# Patient Record
Sex: Female | Born: 1970 | Race: Black or African American | Hispanic: No | Marital: Single | Smoking: Never smoker
Health system: Southern US, Community
[De-identification: ages and names within clinical notes are randomized; demographics above are authoritative.]

## PROBLEM LIST (undated history)

## (undated) DIAGNOSIS — K219 Gastro-esophageal reflux disease without esophagitis: Secondary | ICD-10-CM

## (undated) DIAGNOSIS — E785 Hyperlipidemia, unspecified: Secondary | ICD-10-CM

## (undated) DIAGNOSIS — I10 Essential (primary) hypertension: Secondary | ICD-10-CM

## (undated) DIAGNOSIS — E119 Type 2 diabetes mellitus without complications: Secondary | ICD-10-CM

## (undated) HISTORY — DX: Type 2 diabetes mellitus without complications: E11.9

## (undated) HISTORY — DX: Essential (primary) hypertension: I10

## (undated) HISTORY — DX: Hyperlipidemia, unspecified: E78.5

## (undated) HISTORY — DX: Gastro-esophageal reflux disease without esophagitis: K21.9

---

## 2005-06-06 ENCOUNTER — Other Ambulatory Visit: Admission: RE | Admit: 2005-06-06 | Discharge: 2005-06-06 | Payer: Self-pay | Admitting: Family Medicine

## 2010-06-07 ENCOUNTER — Encounter
Admission: RE | Admit: 2010-06-07 | Discharge: 2010-06-07 | Payer: Self-pay | Source: Home / Self Care | Attending: Obstetrics & Gynecology | Admitting: Obstetrics & Gynecology

## 2011-05-13 ENCOUNTER — Other Ambulatory Visit: Payer: Self-pay | Admitting: Obstetrics & Gynecology

## 2011-05-13 DIAGNOSIS — Z1231 Encounter for screening mammogram for malignant neoplasm of breast: Secondary | ICD-10-CM

## 2011-06-09 ENCOUNTER — Ambulatory Visit
Admission: RE | Admit: 2011-06-09 | Discharge: 2011-06-09 | Disposition: A | Discharge status: Home / Self Care | Payer: BC Managed Care – PPO | Source: Ambulatory Visit | Attending: Obstetrics & Gynecology | Admitting: Obstetrics & Gynecology

## 2011-06-09 DIAGNOSIS — Z1231 Encounter for screening mammogram for malignant neoplasm of breast: Secondary | ICD-10-CM

## 2012-05-04 ENCOUNTER — Other Ambulatory Visit: Payer: Self-pay | Admitting: Obstetrics & Gynecology

## 2012-05-04 ENCOUNTER — Other Ambulatory Visit: Payer: Self-pay | Admitting: Internal Medicine

## 2012-05-04 DIAGNOSIS — Z1231 Encounter for screening mammogram for malignant neoplasm of breast: Secondary | ICD-10-CM

## 2012-05-13 ENCOUNTER — Ambulatory Visit: Payer: BC Managed Care – PPO

## 2013-05-13 ENCOUNTER — Other Ambulatory Visit: Payer: Self-pay

## 2013-05-13 DIAGNOSIS — Z1231 Encounter for screening mammogram for malignant neoplasm of breast: Secondary | ICD-10-CM

## 2013-06-17 ENCOUNTER — Ambulatory Visit
Admission: RE | Admit: 2013-06-17 | Discharge: 2013-06-17 | Disposition: A | Discharge status: Home / Self Care | Payer: BC Managed Care – PPO | Source: Ambulatory Visit

## 2013-06-17 DIAGNOSIS — Z1231 Encounter for screening mammogram for malignant neoplasm of breast: Secondary | ICD-10-CM

## 2014-05-15 ENCOUNTER — Other Ambulatory Visit: Payer: Self-pay

## 2014-05-15 DIAGNOSIS — Z1231 Encounter for screening mammogram for malignant neoplasm of breast: Secondary | ICD-10-CM

## 2014-06-22 ENCOUNTER — Ambulatory Visit
Admission: RE | Admit: 2014-06-22 | Discharge: 2014-06-22 | Disposition: A | Discharge status: Home / Self Care | Payer: BC Managed Care – PPO | Source: Ambulatory Visit

## 2014-06-22 DIAGNOSIS — Z1231 Encounter for screening mammogram for malignant neoplasm of breast: Secondary | ICD-10-CM

## 2014-08-25 ENCOUNTER — Ambulatory Visit: Payer: BLUE CROSS/BLUE SHIELD | Admitting: Podiatry

## 2016-05-02 ENCOUNTER — Other Ambulatory Visit: Payer: Self-pay | Admitting: Family Medicine

## 2016-05-02 DIAGNOSIS — Z1231 Encounter for screening mammogram for malignant neoplasm of breast: Secondary | ICD-10-CM

## 2016-06-06 ENCOUNTER — Ambulatory Visit
Admission: RE | Admit: 2016-06-06 | Discharge: 2016-06-06 | Disposition: A | Discharge status: Home / Self Care | Payer: 59 | Source: Ambulatory Visit | Attending: Family Medicine | Admitting: Family Medicine

## 2016-06-06 DIAGNOSIS — Z1231 Encounter for screening mammogram for malignant neoplasm of breast: Secondary | ICD-10-CM

## 2017-03-27 ENCOUNTER — Other Ambulatory Visit: Payer: Self-pay | Admitting: Family Medicine

## 2017-03-27 DIAGNOSIS — Z1231 Encounter for screening mammogram for malignant neoplasm of breast: Secondary | ICD-10-CM

## 2017-06-08 ENCOUNTER — Encounter: Payer: Self-pay | Admitting: Radiology

## 2017-06-08 ENCOUNTER — Ambulatory Visit
Admission: RE | Admit: 2017-06-08 | Discharge: 2017-06-08 | Disposition: A | Discharge status: Home / Self Care | Payer: 59 | Source: Ambulatory Visit | Attending: Family Medicine | Admitting: Family Medicine

## 2017-06-08 DIAGNOSIS — Z1231 Encounter for screening mammogram for malignant neoplasm of breast: Secondary | ICD-10-CM

## 2018-03-04 ENCOUNTER — Other Ambulatory Visit: Payer: Self-pay | Admitting: Family Medicine

## 2018-03-04 DIAGNOSIS — Z1231 Encounter for screening mammogram for malignant neoplasm of breast: Secondary | ICD-10-CM

## 2018-06-15 ENCOUNTER — Ambulatory Visit
Admission: RE | Admit: 2018-06-15 | Discharge: 2018-06-15 | Disposition: A | Discharge status: Home / Self Care | Payer: 59 | Source: Ambulatory Visit | Attending: Family Medicine | Admitting: Family Medicine

## 2018-06-15 DIAGNOSIS — Z1231 Encounter for screening mammogram for malignant neoplasm of breast: Secondary | ICD-10-CM

## 2019-05-06 ENCOUNTER — Other Ambulatory Visit: Payer: Self-pay | Admitting: Family Medicine

## 2019-05-06 DIAGNOSIS — Z1231 Encounter for screening mammogram for malignant neoplasm of breast: Secondary | ICD-10-CM

## 2019-07-01 ENCOUNTER — Ambulatory Visit: Payer: 59

## 2019-09-19 ENCOUNTER — Ambulatory Visit: Payer: BLUE CROSS/BLUE SHIELD | Attending: Internal Medicine

## 2019-09-19 DIAGNOSIS — Z23 Encounter for immunization: Secondary | ICD-10-CM

## 2019-09-19 NOTE — Progress Notes (Signed)
   Covid-19 Vaccination Clinic  Name:  Wendy Sanford    MRN: [id number] DOB: 1971-06-03  09/19/2019  [name] was observed post Covid-19 immunization for 15 minutes without incident. She was provided with Vaccine Information Sheet and instruction to access the V-Safe system.   [name] was instructed to call 911 with any severe reactions post vaccine: Marland Kitchen Difficulty breathing  . Swelling of face and throat  . A fast heartbeat  . A bad rash all over body  . Dizziness and weakness   Immunizations Administered    Name Date Dose VIS Date Route   Pfizer COVID-19 Vaccine 09/19/2019 11:55 AM 0.3 mL 06/03/2019 Intramuscular   Manufacturer: [employer], [employer]   Lot: [id number]   NDC: [address]-6

## 2019-10-12 ENCOUNTER — Ambulatory Visit: Payer: BLUE CROSS/BLUE SHIELD | Attending: Internal Medicine

## 2019-10-12 DIAGNOSIS — Z23 Encounter for immunization: Secondary | ICD-10-CM

## 2019-10-12 NOTE — Progress Notes (Signed)
   Covid-19 Vaccination Clinic  Name:  Wendy Sanford    MRN: [id number] DOB: [birth year]  10/12/2019  [name] was observed post Covid-19 immunization for 15 minutes without incident. She was provided with Vaccine Information Sheet and instruction to access the V-Safe system.   [name] was instructed to call 911 with any severe reactions post vaccine: Marland Kitchen Difficulty breathing  . Swelling of face and throat  . A fast heartbeat  . A bad rash all over body  . Dizziness and weakness   Immunizations Administered    Name Date Dose VIS Date Route   Pfizer COVID-19 Vaccine 10/12/2019  8:15 AM 0.3 mL 08/17/2018 Intramuscular   Manufacturer: [employer], [employer]   Lot: [id number]   NDC: [address]-6

## 2020-02-02 DIAGNOSIS — E785 Hyperlipidemia, unspecified: Secondary | ICD-10-CM | POA: Diagnosis not present

## 2020-02-02 DIAGNOSIS — R29818 Other symptoms and signs involving the nervous system: Secondary | ICD-10-CM | POA: Diagnosis not present

## 2020-02-02 DIAGNOSIS — I6350 Cerebral infarction due to unspecified occlusion or stenosis of unspecified cerebral artery: Secondary | ICD-10-CM | POA: Diagnosis not present

## 2020-02-02 DIAGNOSIS — I10 Essential (primary) hypertension: Secondary | ICD-10-CM | POA: Diagnosis not present

## 2020-04-13 ENCOUNTER — Other Ambulatory Visit: Payer: Self-pay | Admitting: Family Medicine

## 2020-04-13 DIAGNOSIS — Z1231 Encounter for screening mammogram for malignant neoplasm of breast: Secondary | ICD-10-CM

## 2020-04-18 ENCOUNTER — Ambulatory Visit
Admission: RE | Admit: 2020-04-18 | Discharge: 2020-04-18 | Disposition: A | Discharge status: Home / Self Care | Payer: BLUE CROSS/BLUE SHIELD | Source: Ambulatory Visit | Attending: Family Medicine | Admitting: Family Medicine

## 2020-04-18 ENCOUNTER — Other Ambulatory Visit: Payer: Self-pay

## 2020-04-18 DIAGNOSIS — Z1231 Encounter for screening mammogram for malignant neoplasm of breast: Secondary | ICD-10-CM | POA: Diagnosis not present

## 2020-08-07 DIAGNOSIS — I69398 Other sequelae of cerebral infarction: Secondary | ICD-10-CM | POA: Diagnosis not present

## 2020-08-07 DIAGNOSIS — R269 Unspecified abnormalities of gait and mobility: Secondary | ICD-10-CM | POA: Diagnosis not present

## 2020-08-07 DIAGNOSIS — I639 Cerebral infarction, unspecified: Secondary | ICD-10-CM | POA: Diagnosis not present

## 2020-08-07 DIAGNOSIS — J329 Chronic sinusitis, unspecified: Secondary | ICD-10-CM | POA: Diagnosis not present

## 2020-08-07 DIAGNOSIS — M2550 Pain in unspecified joint: Secondary | ICD-10-CM | POA: Diagnosis not present

## 2020-09-26 DIAGNOSIS — E1159 Type 2 diabetes mellitus with other circulatory complications: Secondary | ICD-10-CM | POA: Diagnosis not present

## 2020-09-26 DIAGNOSIS — I152 Hypertension secondary to endocrine disorders: Secondary | ICD-10-CM | POA: Diagnosis not present

## 2020-09-26 DIAGNOSIS — B372 Candidiasis of skin and nail: Secondary | ICD-10-CM | POA: Diagnosis not present

## 2020-09-26 DIAGNOSIS — Z0001 Encounter for general adult medical examination with abnormal findings: Secondary | ICD-10-CM | POA: Diagnosis not present

## 2020-09-26 DIAGNOSIS — J329 Chronic sinusitis, unspecified: Secondary | ICD-10-CM | POA: Diagnosis not present

## 2020-09-26 DIAGNOSIS — E1129 Type 2 diabetes mellitus with other diabetic kidney complication: Secondary | ICD-10-CM | POA: Diagnosis not present

## 2020-09-26 DIAGNOSIS — Z23 Encounter for immunization: Secondary | ICD-10-CM | POA: Diagnosis not present

## 2020-09-26 DIAGNOSIS — Z1211 Encounter for screening for malignant neoplasm of colon: Secondary | ICD-10-CM | POA: Diagnosis not present

## 2020-09-26 DIAGNOSIS — E118 Type 2 diabetes mellitus with unspecified complications: Secondary | ICD-10-CM | POA: Diagnosis not present

## 2020-09-26 DIAGNOSIS — Z0000 Encounter for general adult medical examination without abnormal findings: Secondary | ICD-10-CM | POA: Diagnosis not present

## 2020-12-05 DIAGNOSIS — E1165 Type 2 diabetes mellitus with hyperglycemia: Secondary | ICD-10-CM | POA: Diagnosis not present

## 2020-12-05 DIAGNOSIS — G4489 Other headache syndrome: Secondary | ICD-10-CM | POA: Diagnosis not present

## 2020-12-05 DIAGNOSIS — E1159 Type 2 diabetes mellitus with other circulatory complications: Secondary | ICD-10-CM | POA: Diagnosis not present

## 2020-12-05 DIAGNOSIS — R040 Epistaxis: Secondary | ICD-10-CM | POA: Diagnosis not present

## 2020-12-17 DIAGNOSIS — R040 Epistaxis: Secondary | ICD-10-CM | POA: Diagnosis not present

## 2020-12-17 DIAGNOSIS — G4489 Other headache syndrome: Secondary | ICD-10-CM | POA: Diagnosis not present

## 2020-12-17 DIAGNOSIS — Z1211 Encounter for screening for malignant neoplasm of colon: Secondary | ICD-10-CM | POA: Diagnosis not present

## 2020-12-17 DIAGNOSIS — Z6841 Body Mass Index (BMI) 40.0 and over, adult: Secondary | ICD-10-CM | POA: Diagnosis not present

## 2021-02-04 DIAGNOSIS — I69398 Other sequelae of cerebral infarction: Secondary | ICD-10-CM | POA: Diagnosis not present

## 2021-02-04 DIAGNOSIS — I639 Cerebral infarction, unspecified: Secondary | ICD-10-CM | POA: Diagnosis not present

## 2021-02-04 DIAGNOSIS — E785 Hyperlipidemia, unspecified: Secondary | ICD-10-CM | POA: Diagnosis not present

## 2021-02-04 DIAGNOSIS — G40909 Epilepsy, unspecified, not intractable, without status epilepticus: Secondary | ICD-10-CM | POA: Diagnosis not present

## 2021-05-28 DIAGNOSIS — E118 Type 2 diabetes mellitus with unspecified complications: Secondary | ICD-10-CM | POA: Diagnosis not present

## 2021-05-28 DIAGNOSIS — R635 Abnormal weight gain: Secondary | ICD-10-CM | POA: Diagnosis not present

## 2021-05-28 DIAGNOSIS — D696 Thrombocytopenia, unspecified: Secondary | ICD-10-CM | POA: Diagnosis not present

## 2021-05-28 DIAGNOSIS — R600 Localized edema: Secondary | ICD-10-CM | POA: Diagnosis not present

## 2021-05-28 DIAGNOSIS — E1165 Type 2 diabetes mellitus with hyperglycemia: Secondary | ICD-10-CM | POA: Diagnosis not present

## 2021-05-28 LAB — CBC AND DIFFERENTIAL: Hemoglobin: 10.4 — AB (ref 12.0–16.0)

## 2021-05-28 LAB — MICROALBUMIN / CREATININE URINE RATIO

## 2021-05-28 LAB — BASIC METABOLIC PANEL: Creatinine: 0.9 (ref 0.5–1.1)

## 2021-05-28 LAB — HEMOGLOBIN A1C: Hemoglobin A1C: 9.1

## 2021-05-31 DIAGNOSIS — R600 Localized edema: Secondary | ICD-10-CM | POA: Diagnosis not present

## 2021-07-23 ENCOUNTER — Encounter: Payer: Self-pay | Admitting: Family Medicine

## 2021-07-23 ENCOUNTER — Other Ambulatory Visit: Payer: Self-pay

## 2021-07-23 ENCOUNTER — Ambulatory Visit (INDEPENDENT_AMBULATORY_CARE_PROVIDER_SITE_OTHER): Payer: BLUE CROSS/BLUE SHIELD | Admitting: Family Medicine

## 2021-07-23 VITALS — BP 140/90 | HR 87 | Temp 98.4°F | Ht 66.0 in | Wt 284.5 lb

## 2021-07-23 DIAGNOSIS — E1165 Type 2 diabetes mellitus with hyperglycemia: Secondary | ICD-10-CM | POA: Insufficient documentation

## 2021-07-23 DIAGNOSIS — D649 Anemia, unspecified: Secondary | ICD-10-CM

## 2021-07-23 DIAGNOSIS — R600 Localized edema: Secondary | ICD-10-CM

## 2021-07-23 LAB — CBC WITH DIFFERENTIAL/PLATELET
Basophils Absolute: 0 10*3/uL (ref 0.0–0.1)
Basophils Relative: 0.4 % (ref 0.0–3.0)
Eosinophils Absolute: 0.1 10*3/uL (ref 0.0–0.7)
Eosinophils Relative: 1.5 % (ref 0.0–5.0)
HCT: 33.6 % — ABNORMAL LOW (ref 36.0–46.0)
Hemoglobin: 10.8 g/dL — ABNORMAL LOW (ref 12.0–15.0)
Lymphocytes Relative: 16.2 % (ref 12.0–46.0)
Lymphs Abs: 0.8 10*3/uL (ref 0.7–4.0)
MCHC: 32.1 g/dL (ref 30.0–36.0)
MCV: 82.6 fl (ref 78.0–100.0)
Monocytes Absolute: 0.4 10*3/uL (ref 0.1–1.0)
Monocytes Relative: 8.3 % (ref 3.0–12.0)
Neutro Abs: 3.6 10*3/uL (ref 1.4–7.7)
Neutrophils Relative %: 73.6 % (ref 43.0–77.0)
Platelets: 144 10*3/uL — ABNORMAL LOW (ref 150.0–400.0)
RBC: 4.06 Mil/uL (ref 3.87–5.11)
RDW: 16 % — ABNORMAL HIGH (ref 11.5–15.5)
WBC: 4.8 10*3/uL (ref 4.0–10.5)

## 2021-07-23 LAB — IBC + FERRITIN
Ferritin: 26.3 ng/mL (ref 10.0–291.0)
Iron: 42 ug/dL (ref 42–145)
Saturation Ratios: 12.6 % — ABNORMAL LOW (ref 20.0–50.0)
TIBC: 333.2 ug/dL (ref 250.0–450.0)
Transferrin: 238 mg/dL (ref 212.0–360.0)

## 2021-07-23 LAB — VITAMIN B12: Vitamin B-12: 505 pg/mL (ref 211–911)

## 2021-07-23 MED ORDER — EMPAGLIFLOZIN 25 MG PO TABS: 25.0000 mg | ORAL_TABLET | Freq: Every day | ORAL | 1 refills | Status: DC

## 2021-07-23 MED ORDER — GLUCOSE BLOOD VI STRP: 1.0000 | ORAL_STRIP | Freq: Every day | 3 refills | Status: AC

## 2021-07-23 MED ORDER — FUROSEMIDE 20 MG PO TABS: 20.0000 mg | ORAL_TABLET | Freq: Every day | ORAL | 1 refills | Status: DC

## 2021-07-23 NOTE — Patient Instructions (Addendum)
Welcome to [facility]! It was a pleasure meeting you today.  As discussed, Please schedule a 3 month follow up visit today.  Goals for fasting sugar-<130.  Before meal <140.  2 hrs after meal <160.  Jardiance-start with 1/2 tab per day for 1 week then increase to whole tab Lasix(furosemide) for fluid for 2-3 days and let me know.  PLEASE NOTE:  If you had any LAB tests please let us know if you have not heard back within a few days. You may see your results on MyChart before we have a chance to review them but we will give you a call once they are reviewed by [place]. If we ordered any REFERRALS today, please let us know if you have not heard from their office within the next week.  Let us know through MyChart if you are needing REFILLS, or have your pharmacy send [place] the request. You can also use MyChart to communicate with me or any office staff.  Please try these tips to maintain a healthy lifestyle:  Eat most of your calories during the day when you are active. Eliminate processed foods including packaged sweets (pies, cakes, cookies), reduce intake of potatoes, white bread, white pasta, and white rice. Look for whole grain options, oat flour or almond flour.  Each meal should contain half fruits/vegetables, one quarter protein, and one quarter carbs (no bigger than a computer mouse).  Cut down on sweet beverages. This includes juice, soda, and sweet tea. Also watch fruit intake, though this is a healthier sweet option, it still contains natural sugar! Limit to 3 servings daily.  Drink at least 1 glass of water with each meal and aim for at least 8 glasses per day  Exercise at least 150 minutes every week.

## 2021-07-23 NOTE — Addendum Note (Signed)
Addended by: Angelena Sole on: 07/23/2021 01:02 PM   Modules accepted: Orders

## 2021-07-23 NOTE — Progress Notes (Signed)
New Patient Office Visit  Subjective:  Patient ID: Wendy Sanford, female    DOB: 10/11/1970  Age: 51 y.o. MRN: [id number]  CC:  Chief Complaint  Patient presents with   Establish Care    HPI Wendy Sanford presents for establish care-xfer from [facility]   Edema intermitt and intermitt numbness hands 1 yr ago.  Numbness improved x pinkie still "sensitive" to touch it-L worst.  Pt R handed. Edema in legs have improved some.   Weight increased 20# 1 yr ago after vacation. Legs swollen and tight. Had echo and dopplers-reviewed w/pt.  2.  DM type 2-checks 1-2x/wk-192 fasting.   Drinks a lot of sweet tea.  No exercise.  Willing to see nutritionist. Doesn't like needles.  Had labs done in December w/[facility].   3.  Menses regular until 1 yr ago.  Pt not SA.  LMP-Nov.  Not heavy-  reviewed labs from 05/28/21   4.  Anemia and Plts low-now bleeding pt aware of.        Past Medical History:  Diagnosis Date   Diabetes mellitus without complication (HCC)    GERD (gastroesophageal reflux disease)    Hyperlipidemia    Hypertension     History reviewed. No pertinent surgical history.  Family History  Problem Relation Age of Onset   Hypertension Mother    Diabetes Mother    Heart attack Father    Diabetes Brother    Hypertension Brother    Breast cancer Cousin        unsure how old   Breast cancer Cousin        unsure how old    Social History   Socioeconomic History   Marital status: Single    Spouse name: Not on file   Number of children: 0   Years of education: Not on file   Highest education level: Not on file  Occupational History   Not on file  Tobacco Use   Smoking status: Never   Smokeless tobacco: Never  Vaping Use   Vaping Use: Never used  Substance and Sexual Activity   Alcohol use: Never   Drug use: Never   Sexual activity: Not Currently  Other Topics Concern   Not on file  Social History Narrative   Bussiness analyst.   Has boyfriend   Social  Determinants of Health   Financial Resource Strain: Not on file  Food Insecurity: Not on file  Transportation Needs: Not on file  Physical Activity: Not on file  Stress: Not on file  Social Connections: Not on file  Intimate Partner Violence: Not on file    ROS: negative/noncontributory except as in HPI Allergies-year round-meds hold H/o constipation-metamucil helps  Glaucoma appt coming up in June.  Saw opt recently.   Objective:   Today's Vitals: BP 140/90    Pulse 87    Temp 98.4 F (36.9 C) (Temporal)    Ht 5\' 6"  (1.676 m)    Wt 284 lb 8 oz (129 kg)    SpO2 99%    BMI 45.92 kg/m   Gen: WDWN NAD MOAAF HEENT: NCAT, conjunctiva not injected, sclera nonicteric NECK:  supple, no thyromegaly, no nodes, no carotid bruits.  Acanthosis nigrans CARDIAC: RRR, S1S2+, 1/6 sys murmur. DP 2+B LUNGS: CTAB. No wheezes ABDOMEN:  BS+, soft, NTND, No HSM, no masses EXT:  chronic tense edema w/some hyperpigmentation MSK: no gross abnormalities.  NEURO: A&O x3.  CN II-XII intact.  PSYCH: normal mood. Good eye contact  Assessment & Plan:   Problem List Items Addressed This Visit       Endocrine   Type 2 diabetes mellitus with hyperglycemia, without long-term current use of insulin (HCC) - Primary   Relevant Medications   olmesartan-hydrochlorothiazide (BENICAR HCT) 40-25 MG tablet   metFORMIN (GLUCOPHAGE-XR) 500 MG 24 hr tablet   atorvastatin (LIPITOR) 20 MG tablet   glucose blood test strip   empagliflozin (JARDIANCE) 25 MG TABS tablet     Other   Localized edema   Other Visit Diagnoses     Anemia, unspecified type       Relevant Orders   CBC with Differential/Platelet   IBC + Ferritin   Vitamin B12   ANA      DM type 2 w/hyperglycemia. Uncontrolled.  A1C 9.1 on 05/28/21.  Microalb creat ratio unable to calculate-low.  Cr 0.88.  will add Jardiance.  SED.  Refer nutritionist.  Edema-had echo and dopplers.  Will trial lasix few days.  Call w/progress Anemia and plts  low-check labs and ANA. Morbid obesisty-discussed TLC for 74minutes.  Refer nutritionist.  Declines ozempic for now.   F/u 3 mo Spent 50 minutes w/pt reviewing prev doctor's visit, labs, echo, doppler, educating pt on diet/exercise, uncoltrolled DM, plan, etc.   Outpatient Encounter Medications as of 07/23/2021  Medication Sig   atorvastatin (LIPITOR) 20 MG tablet Take 20 mg by mouth daily.   chlorhexidine (PERIDEX) 0.12 % solution 15 mL by Oromucosal route Two (2) times a day.   empagliflozin (JARDIANCE) 25 MG TABS tablet Take 1 tablet (25 mg total) by mouth daily before breakfast.   fluticasone (FLONASE) 50 MCG/ACT nasal spray Place into both nostrils daily.   furosemide (LASIX) 20 MG tablet Take 1 tablet (20 mg total) by mouth daily.   METAMUCIL FIBER PO    metFORMIN (GLUCOPHAGE-XR) 500 MG 24 hr tablet Take 1,000 mg by mouth 2 (two) times daily.   montelukast (SINGULAIR) 10 MG tablet Take 10 mg by mouth daily.   olmesartan-hydrochlorothiazide (BENICAR HCT) 40-25 MG tablet Take 1 tablet by mouth daily.   omeprazole (PRILOSEC) 20 MG capsule Take by mouth.   [DISCONTINUED] glucose blood test strip 1 each daily.   glucose blood test strip 1 each by Other route daily.   [DISCONTINUED] omeprazole (PRILOSEC OTC) 20 MG tablet    No facility-administered encounter medications on file as of 07/23/2021.    Follow-up: Return in about 3 months (around 10/20/2021) for DM.   Wellington Hampshire, MD

## 2021-07-25 ENCOUNTER — Telehealth: Payer: Self-pay | Admitting: Family Medicine

## 2021-07-25 NOTE — Telephone Encounter (Signed)
Pt returning call for Wendy Sanford - lucia

## 2021-07-25 NOTE — Telephone Encounter (Signed)
Returned call to patient. Patient notified of result note message from Dr. Cherlynn Kaiser.

## 2021-07-26 ENCOUNTER — Telehealth: Payer: Self-pay | Admitting: *Deleted

## 2021-07-26 LAB — ANA: Anti Nuclear Antibody (ANA): NEGATIVE

## 2021-07-26 NOTE — Telephone Encounter (Signed)
Called patient to go over remaining lab results. Patient wanted to know if it was possible to get a note saying that going to a gym or buying equipment to exercise would be medically necessary for her. Patient stated that she was reading her benefits and it stated that she could use HSA to pay for cheap equipment or a gym membership if it was deemed medically necessary.

## 2021-07-29 ENCOUNTER — Encounter: Payer: Self-pay | Admitting: Family Medicine

## 2021-07-29 NOTE — Telephone Encounter (Signed)
Letter sent through mychart. Patient notified and verbalized understanding.

## 2021-08-06 ENCOUNTER — Ambulatory Visit: Payer: BLUE CROSS/BLUE SHIELD | Admitting: Family Medicine

## 2021-08-12 DIAGNOSIS — G40909 Epilepsy, unspecified, not intractable, without status epilepticus: Secondary | ICD-10-CM | POA: Diagnosis not present

## 2021-08-12 DIAGNOSIS — I639 Cerebral infarction, unspecified: Secondary | ICD-10-CM | POA: Diagnosis not present

## 2021-08-30 ENCOUNTER — Encounter: Payer: BLUE CROSS/BLUE SHIELD | Attending: Family Medicine | Admitting: Dietician

## 2021-08-30 ENCOUNTER — Encounter: Payer: Self-pay | Admitting: Dietician

## 2021-08-30 ENCOUNTER — Other Ambulatory Visit: Payer: Self-pay

## 2021-08-30 DIAGNOSIS — E1165 Type 2 diabetes mellitus with hyperglycemia: Secondary | ICD-10-CM | POA: Insufficient documentation

## 2021-08-30 NOTE — Patient Instructions (Addendum)
Call your insurance to see what SGLT2 is better covered.  You have the discount card for [place].  Discuss with your MD for a prescription. ? ?Consider asking your doctor for a prescription for a FreeStyle Libre3. [facility].  Your phone supports this app. ? ?Low sodium products: ?Plant Strong Chili ?Silver Palate low sodium pasta sauce ?Ezekiel Bread ? ?Recipe ideas: ?Well your world - you tube ?Dicky Doe - you tube ?Sheet pan meal ? ?Tea with Monk fruit or stevia or less sugar ? ?Calorie Brooke Dare app ? ?Mindful eating: ?Choices ?Eat slowly and stop when satisfied ?Eat away from distraction ? ? ?

## 2021-08-30 NOTE — Progress Notes (Signed)
? ?Diabetes Self-Management Education ? ?Visit Type: First/Initial ? ?Appt. Start Time: 0910 Appt. End Time: [id number] ? ?08/30/2021 ? ?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 female with a diagnosis of Diabetes: Type 2.  ? ?ASSESSMENT ?Patient is here today alone. ?She would like some new meal ideas as they are getting bored. ?She states that she is trying to do things differently this year than other years.   Boyfriend is resistant to some of the diet changes at times.  She has gotten an eye exam. ?She is not checking her blood glucose.  Glucose was 196 today in office. ? ?History includes type 2 diabetes, obesity, edema (legs, feet, ankle), GERD, HLD, HTN. ?Medications include:  Jardiance (not taking due to expense), metformin, atorvastatin, lasis.  She has a MVI but has not started them. ?Labs noted to include 9.1% A1C 05/28/2021 ? ?Weight hx: ?281 lbs 08/30/2021 ?284 lbs 07/23/2021 ? ?Patient lives with her boyfriend. (Boyfriend has diabetes.) She does the shopping and cooking.  She works for [employer] from home (first shift). ?Gained 20 lbs in the past 9 month after a vacation to [place].  Perimenopausal.  Increased edema.  She has stopped adding salt at the table and very small amounts of sea salt in cooking.  "There is always a fight in the house about salt as her boyfriend wants things more seasoned." ?She had some education in the past through a diabetes eating class at work about 5 years ago. ?Just joined the new [employer] and has not yet started.  Plans on going with her boyfriend. ?Added her to the Type 2 diabetes support group email list per her request. ? ?Height 5\' 6"  (1.676 m), weight 282 lb (127.9 kg). ?Body mass index is 45.52 kg/m?. ? ? Diabetes Self-Management Education - 08/30/21 0935   ? ?  ? Visit Information  ? Visit Type First/Initial   ?  ? Initial Visit  ? Diabetes Type Type 2   ? Are you currently following a meal plan? No   ? Are you taking your medications as  prescribed? No   ? Date Diagnosed 2015   ?  ? Health Coping  ? How would you rate your overall health? Good   ?  ? Psychosocial Assessment  ? Patient Belief/Attitude about Diabetes Motivated to manage diabetes   ? Self-care barriers None   ? Self-management support Doctor's office   ? Other persons present Patient   ? Patient Concerns Nutrition/Meal planning;Glycemic Control;Medication   ? Special Needs None   ? Preferred Learning Style No preference indicated   ? Learning Readiness Ready   ? How often do you need to have someone help you when you read instructions, pamphlets, or other written materials from your doctor or pharmacy? 1 - Never   ? What is the last grade level you completed in school? 4 years college   ?  ? Pre-Education Assessment  ? Patient understands the diabetes disease and treatment process. Needs Instruction   ? Patient understands incorporating nutritional management into lifestyle. Needs Instruction   ? Patient undertands incorporating physical activity into lifestyle. Needs Instruction   ? Patient understands using medications safely. Needs Instruction   ? Patient understands monitoring blood glucose, interpreting and using results Needs Instruction   ? Patient understands prevention, detection, and treatment of acute complications. Needs Instruction   ? Patient understands prevention, detection, and treatment of chronic complications. Needs Instruction   ? Patient understands  how to develop strategies to address psychosocial issues. Needs Instruction   ? Patient understands how to develop strategies to promote health/change behavior. Needs Instruction   ?  ? Complications  ? Last HgB A1C per patient/outside source 9.1 %   05/28/2021  ? How often do you check your blood sugar? --   once per week  ? Fasting Blood glucose range (mg/dL) >200   285  ? Postprandial Blood glucose range (mg/dL) 180-200   ? Number of hypoglycemic episodes per month 0   ? Have you had a dilated eye exam in the past 12  months? Yes   ? Have you had a dental exam in the past 12 months? Yes   ? Are you checking your feet? Yes   ? How many days per week are you checking your feet? 7   ?  ? Dietary Intake  ? Breakfast honey nut chex with almond milk OR instant oatmeal (raisin date walnut) OR boiled egg with grits or potatoes   ? Snack (morning) none   ? Lunch leftovers OR deli meat alone or with a sandwich, leftover vegetables   ? Snack (afternoon) rare   ? Dinner encheladas, occasional beans, rare rice, occasional sweet but then skips the starch   ? Snack (evening) none   ? Beverage(s) water, sweet tea, lemon perfect drinks (stevia), selter water, almond milk, occasional cranberry juice   ?  ? Exercise  ? Exercise Type ADL's   ? ?  ?  ? ?  ? ? ?Individualized Plan for Diabetes Self-Management Training:  ? ?Learning Objective:  Patient will have a greater understanding of diabetes self-management. ?Patient education plan is to attend individual and/or group sessions per assessed needs and concerns. ?  ?Plan:  ? ?Patient Instructions  ?Call your insurance to see what SGLT2 is better covered.  You have the discount card for [place].  Discuss with your MD for a prescription. ? ?Consider asking your doctor for a prescription for a FreeStyle Libre3. [facility]  Your phone supports this app. ? ?Low sodium products: ?Plant Strong Chili ?Silver Palate low sodium pasta sauce ?Ezekiel Bread ? ?Recipe ideas: ?Well your world - you tube ?Dondra Prader - you tube ?Sheet pan meal ? ?Tea with Monk fruit or stevia or less sugar ? ?Calorie Edison Pace app ? ?Mindful eating: ?Choices ?Eat slowly and stop when satisfied ?Eat away from distraction ? ? ? ?Expected Outcomes:    ? ?Education material provided: ADA - How to Thrive: A Guide for Your Journey with Diabetes, Food label handouts, Meal plan card, Snack sheet, and Diabetes Resources ? ?If problems or questions, patient to contact team via:  Phone ? ?Future DSME appointment:   ?

## 2021-09-11 ENCOUNTER — Ambulatory Visit: Payer: BLUE CROSS/BLUE SHIELD | Admitting: Registered"

## 2021-10-21 ENCOUNTER — Ambulatory Visit: Payer: BLUE CROSS/BLUE SHIELD | Admitting: Family Medicine

## 2021-10-23 ENCOUNTER — Other Ambulatory Visit: Payer: Self-pay | Admitting: *Deleted

## 2021-10-23 MED ORDER — ATORVASTATIN CALCIUM 20 MG PO TABS: 20.0000 mg | ORAL_TABLET | Freq: Every day | ORAL | 1 refills | Status: DC

## 2021-10-23 MED ORDER — OLMESARTAN MEDOXOMIL-HCTZ 40-25 MG PO TABS: 1.0000 | ORAL_TABLET | Freq: Every day | ORAL | 1 refills | Status: DC

## 2021-11-25 ENCOUNTER — Other Ambulatory Visit: Payer: Self-pay | Admitting: *Deleted

## 2021-11-25 ENCOUNTER — Telehealth: Payer: Self-pay | Admitting: *Deleted

## 2021-11-25 MED ORDER — FLUTICASONE PROPIONATE 50 MCG/ACT NA SUSP: 1.0000 | Freq: Every day | NASAL | 1 refills | Status: AC

## 2021-11-25 NOTE — Telephone Encounter (Signed)
Pharmacy faxed over refill request for Fluticasone Prop 50 mcg spray  Prescribed quantity: 16g  Last refill: 08/07/2020, historical provider

## 2021-11-25 NOTE — Telephone Encounter (Signed)
Rx sent to the pharmacy.

## 2021-11-29 DIAGNOSIS — H40003 Preglaucoma, unspecified, bilateral: Secondary | ICD-10-CM | POA: Diagnosis not present

## 2021-12-03 ENCOUNTER — Other Ambulatory Visit: Payer: Self-pay | Admitting: Family Medicine

## 2021-12-04 ENCOUNTER — Telehealth: Payer: Self-pay | Admitting: Family Medicine

## 2021-12-04 NOTE — Telephone Encounter (Signed)
LVM to schedule an office visit, per [facility]

## 2021-12-13 ENCOUNTER — Ambulatory Visit (INDEPENDENT_AMBULATORY_CARE_PROVIDER_SITE_OTHER): Payer: BC Managed Care – PPO | Admitting: Family Medicine

## 2021-12-13 ENCOUNTER — Encounter: Payer: Self-pay | Admitting: Family Medicine

## 2021-12-13 VITALS — BP 124/82 | HR 73 | Temp 98.0°F | Ht 66.0 in | Wt 282.2 lb

## 2021-12-13 DIAGNOSIS — E1165 Type 2 diabetes mellitus with hyperglycemia: Secondary | ICD-10-CM | POA: Diagnosis not present

## 2021-12-13 DIAGNOSIS — R600 Localized edema: Secondary | ICD-10-CM | POA: Diagnosis not present

## 2021-12-13 DIAGNOSIS — I10 Essential (primary) hypertension: Secondary | ICD-10-CM

## 2021-12-13 LAB — COMPREHENSIVE METABOLIC PANEL
ALT: 21 U/L (ref 0–35)
AST: 16 U/L (ref 0–37)
Albumin: 4.1 g/dL (ref 3.5–5.2)
Alkaline Phosphatase: 66 U/L (ref 39–117)
BUN: 14 mg/dL (ref 6–23)
CO2: 31 mEq/L (ref 19–32)
Calcium: 9.6 mg/dL (ref 8.4–10.5)
Chloride: 97 mEq/L (ref 96–112)
Creatinine, Ser: 1 mg/dL (ref 0.40–1.20)
GFR: 64.54 mL/min (ref >60.00)
Glucose, Bld: 180 mg/dL — ABNORMAL HIGH (ref 70–99)
Potassium: 4 mEq/L (ref 3.5–5.1)
Sodium: 136 mEq/L (ref 135–145)
Total Bilirubin: 0.5 mg/dL (ref 0.2–1.2)
Total Protein: 8 g/dL (ref 6.0–8.3)

## 2021-12-13 LAB — LIPID PANEL
Cholesterol: 127 mg/dL (ref 0–200)
HDL: 45.7 mg/dL (ref >39.00)
LDL Cholesterol: 64 mg/dL (ref 0–99)
NonHDL: 81.06
Total CHOL/HDL Ratio: 3
Triglycerides: 85 mg/dL (ref 0.0–149.0)
VLDL: 17 mg/dL (ref 0.0–40.0)

## 2021-12-13 LAB — CBC
HCT: 34.7 % — ABNORMAL LOW (ref 36.0–46.0)
Hemoglobin: 11.2 g/dL — ABNORMAL LOW (ref 12.0–15.0)
MCHC: 32.4 g/dL (ref 30.0–36.0)
MCV: 81.9 fl (ref 78.0–100.0)
Platelets: 167 10*3/uL (ref 150.0–400.0)
RBC: 4.24 Mil/uL (ref 3.87–5.11)
RDW: 16.9 % — ABNORMAL HIGH (ref 11.5–15.5)
WBC: 5 10*3/uL (ref 4.0–10.5)

## 2021-12-13 LAB — HEMOGLOBIN A1C: Hgb A1c MFr Bld: 9.7 % — ABNORMAL HIGH (ref 4.6–6.5)

## 2021-12-13 MED ORDER — GLIMEPIRIDE 2 MG PO TABS: 2.0000 mg | ORAL_TABLET | Freq: Every day | ORAL | 3 refills | Status: DC

## 2021-12-13 MED ORDER — FUROSEMIDE 20 MG PO TABS: 20.0000 mg | ORAL_TABLET | Freq: Every day | ORAL | 1 refills | Status: DC

## 2022-03-17 ENCOUNTER — Encounter: Payer: Self-pay | Admitting: *Deleted

## 2022-04-21 ENCOUNTER — Ambulatory Visit (INDEPENDENT_AMBULATORY_CARE_PROVIDER_SITE_OTHER): Payer: BC Managed Care – PPO | Admitting: Podiatry

## 2022-04-21 ENCOUNTER — Ambulatory Visit (INDEPENDENT_AMBULATORY_CARE_PROVIDER_SITE_OTHER): Payer: BC Managed Care – PPO

## 2022-04-21 DIAGNOSIS — E119 Type 2 diabetes mellitus without complications: Secondary | ICD-10-CM | POA: Diagnosis not present

## 2022-04-21 DIAGNOSIS — M7752 Other enthesopathy of left foot: Secondary | ICD-10-CM

## 2022-04-21 NOTE — Progress Notes (Signed)
   Chief Complaint  Patient presents with   Foot Pain    Left ankle pain  Pt stated that she rolled it back in September and is still having some pain and discomfort with it     HPI: 51 y.o. female presenting today as a new patient for evaluation of left ankle tenderness as well as some slight discomfort to the forefoot.  Patient states that there is an uneven ledge at her house and she steps on it which causes some bruising to the foot and irritation of the ankle.  She noticed the pain about 2 weeks ago however currently it is about 3/10 and very tolerable.  Denies a history of renal injury.  She has not done anything for treatment. Patient also due for routine diabetic foot exam.  Past Medical History:  Diagnosis Date   Diabetes mellitus without complication ([place])    GERD (gastroesophageal reflux disease)    Hyperlipidemia    Hypertension     No past surgical history on file.  Allergies  Allergen Reactions   Lisinopril Cough     Physical Exam: General: The patient is alert and oriented x3 in no acute distress.  Dermatology: Skin is warm, dry and supple bilateral lower extremities. Negative for open lesions or macerations.  Vascular: Palpable pedal pulses bilaterally. Capillary refill within normal limits.  Chronic edema noted bilateral legs and feet  Neurological: Grossly intact via light touch  Musculoskeletal Exam: No pedal deformities noted.  There is some very mild tenderness with palpation throughout the lateral aspect of the left ankle.  No pain with palpation or range of motion to the forefoot  Radiographic Exam LT ankle 04/21/2022:  Normal osseous mineralization. Joint spaces preserved. No fracture/dislocation/boney destruction.  Surrounding soft tissue cross edema noted  Assessment: 1.  Ankle sprain LT   Plan of Care:  1. Patient evaluated. X-Rays reviewed.  2.  Currently the pain is very minimal.  She may continue wearing good supportive shoes and sneakers.   Advised against going barefoot 3.  Comprehensive diabetic foot exam also performed today 4.  Return to clinic as needed      Edrick Kins, DPM [facility]  Dr. Edrick Kins, DPM    [address], [place] [address]                Office [phone]  Fax [phone]

## 2022-05-16 ENCOUNTER — Other Ambulatory Visit: Payer: Self-pay | Admitting: Family Medicine

## 2022-05-17 ENCOUNTER — Other Ambulatory Visit: Payer: Self-pay | Admitting: Family Medicine

## 2022-05-17 NOTE — Telephone Encounter (Signed)
Needs appointment

## 2022-05-19 ENCOUNTER — Telehealth: Payer: Self-pay | Admitting: Family Medicine

## 2022-05-19 NOTE — Telephone Encounter (Signed)
Pt needs an appt per Ruthine Dose. LVM to call & schedule.

## 2022-06-05 ENCOUNTER — Encounter: Payer: Self-pay | Admitting: *Deleted

## 2022-06-17 ENCOUNTER — Other Ambulatory Visit: Payer: Self-pay | Admitting: Family Medicine

## 2022-07-16 ENCOUNTER — Other Ambulatory Visit: Payer: Self-pay | Admitting: Family Medicine

## 2022-08-10 IMAGING — MG DIGITAL SCREENING BILAT W/ TOMO W/ CAD
8 of 15 series · 8 of 40 positions shown · non-contrast
Comparison: Previous exam(s).

CLINICAL DATA: Screening.

EXAM:
DIGITAL SCREENING BILATERAL MAMMOGRAM WITH TOMO AND CAD

[R CC synth-2D]
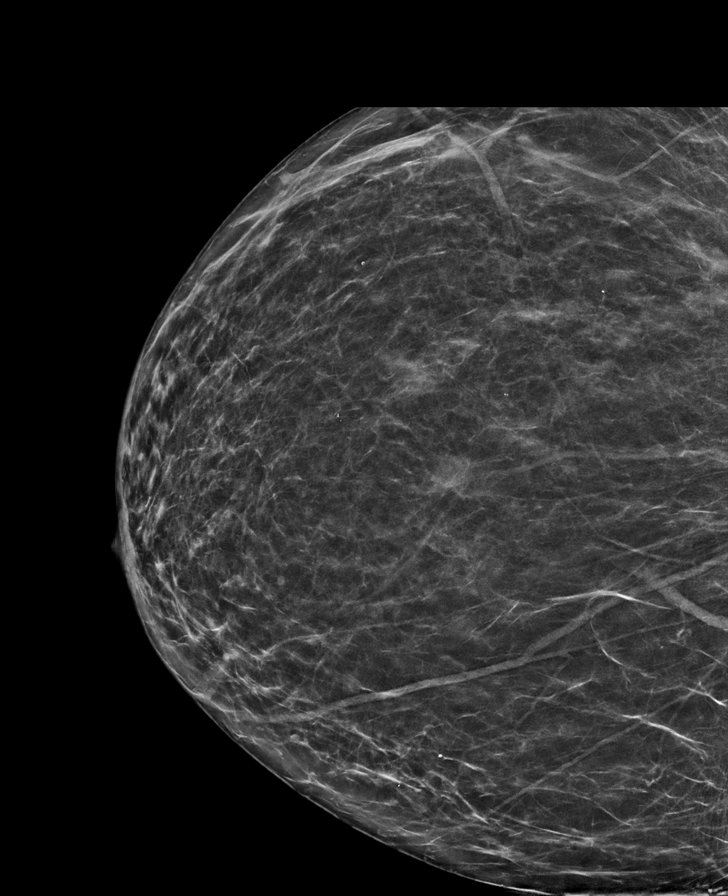

[R MLO synth-2D (1 of 2)]
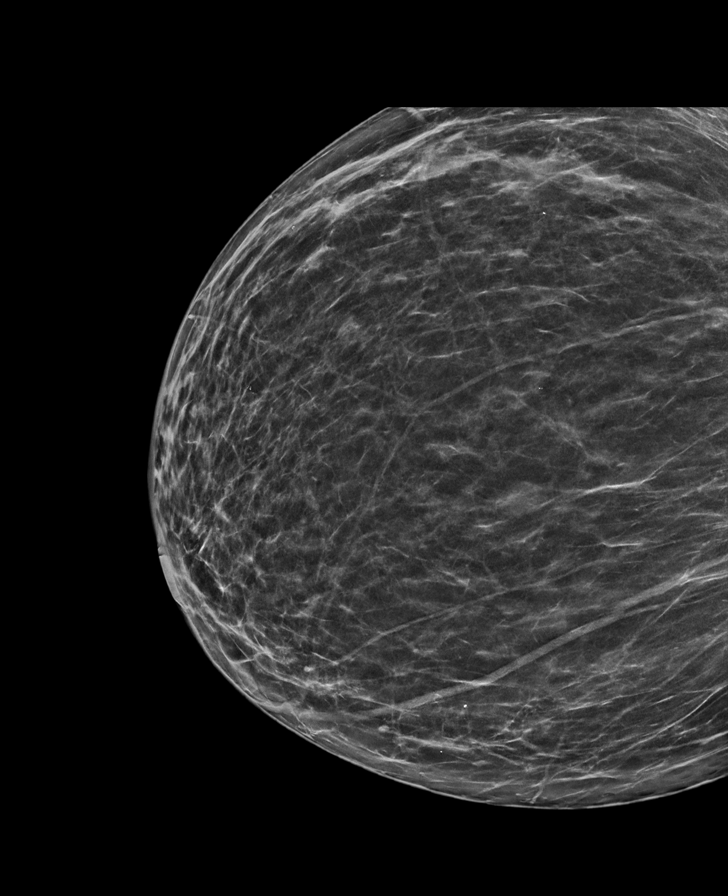

[L CC synth-2D (1 of 2)]
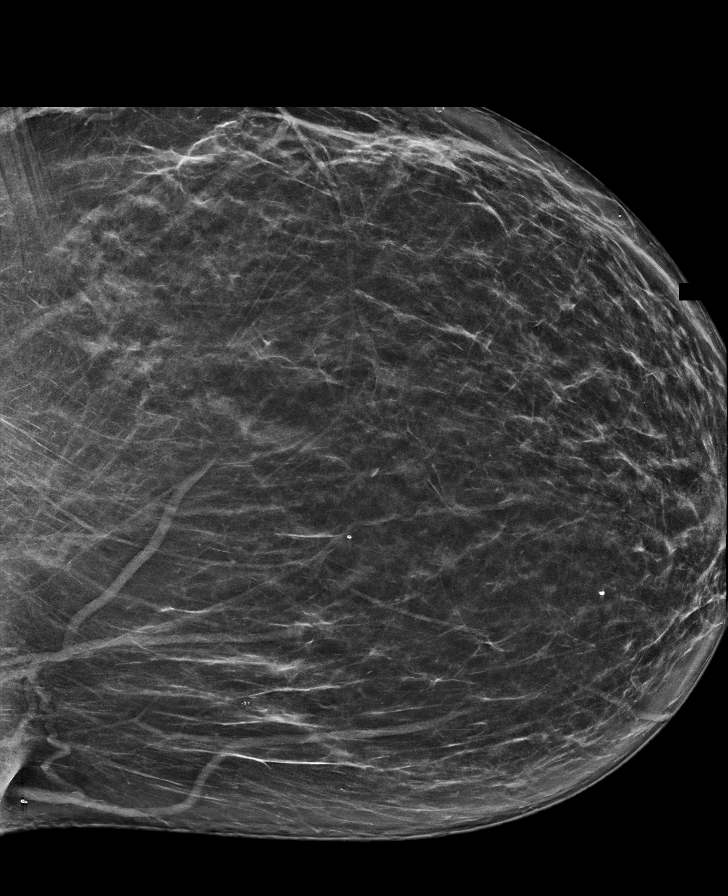

[L MLO synth-2D (1 of 2)]
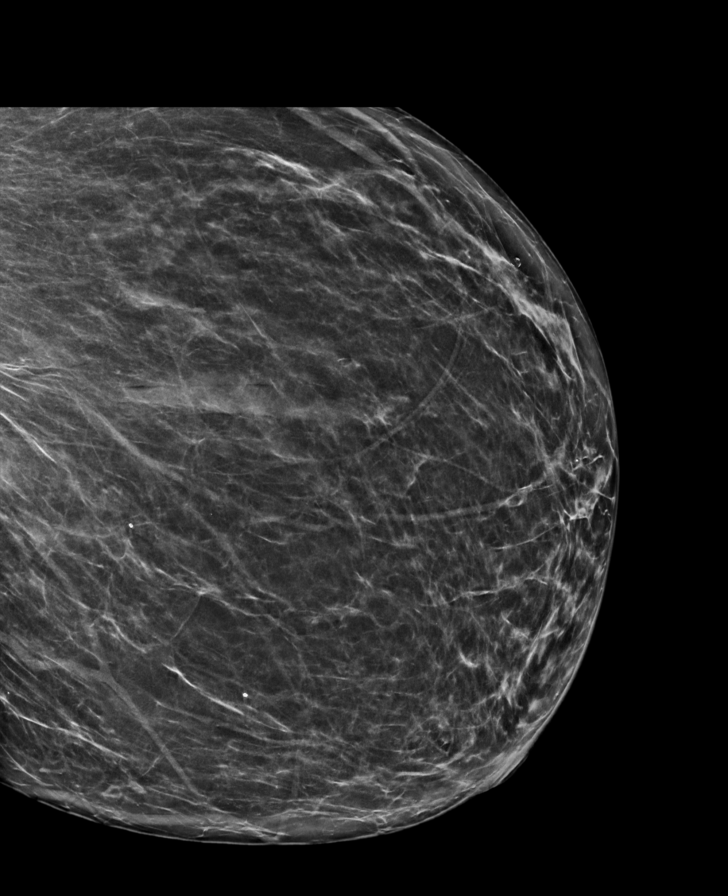

[R MLO synth-2D (2 of 2)]
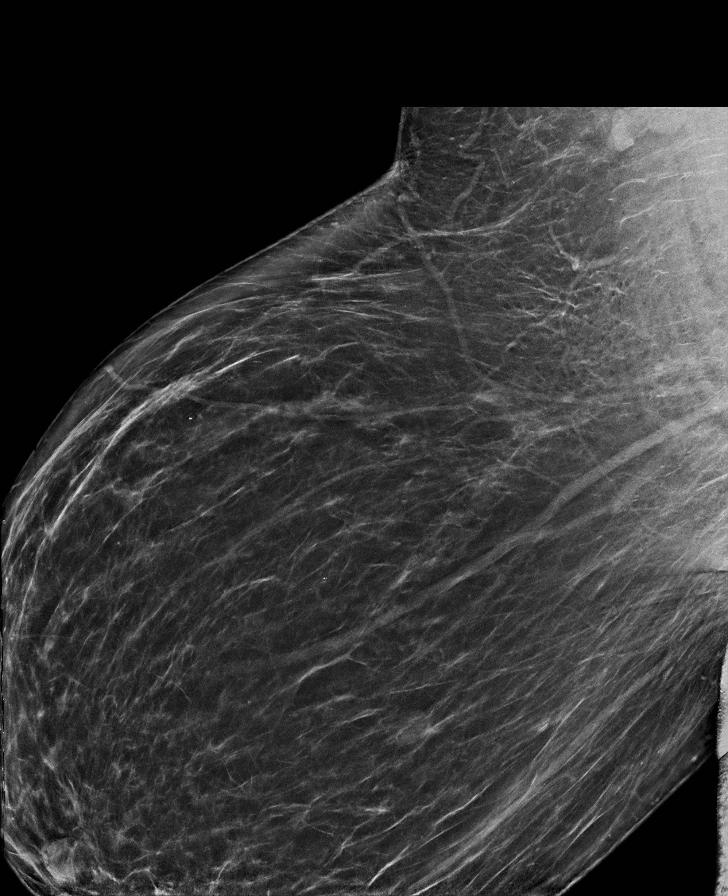

[L CC synth-2D (2 of 2)]
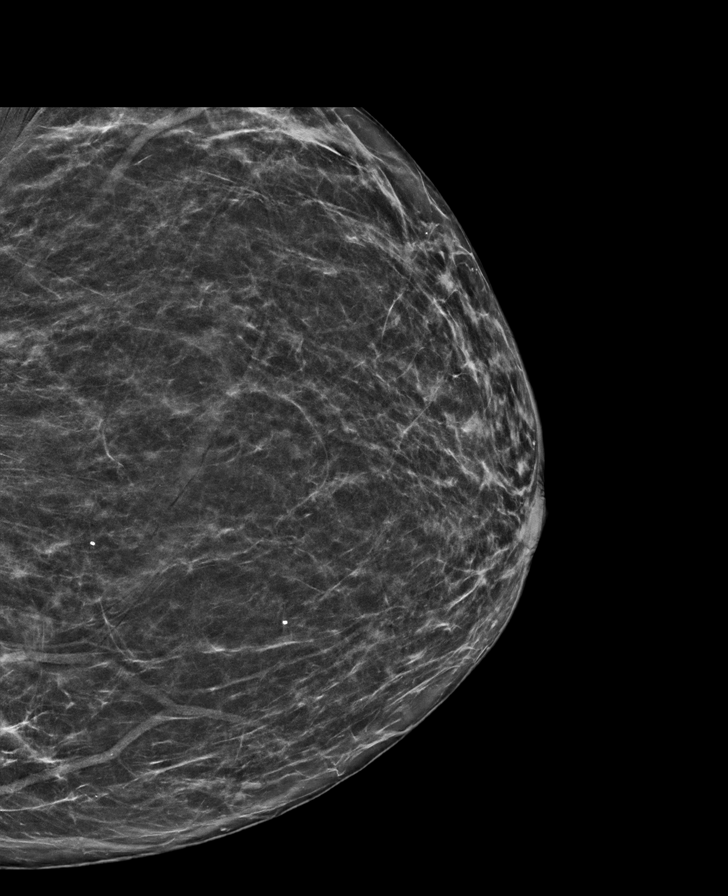

[L MLO synth-2D (2 of 2)]
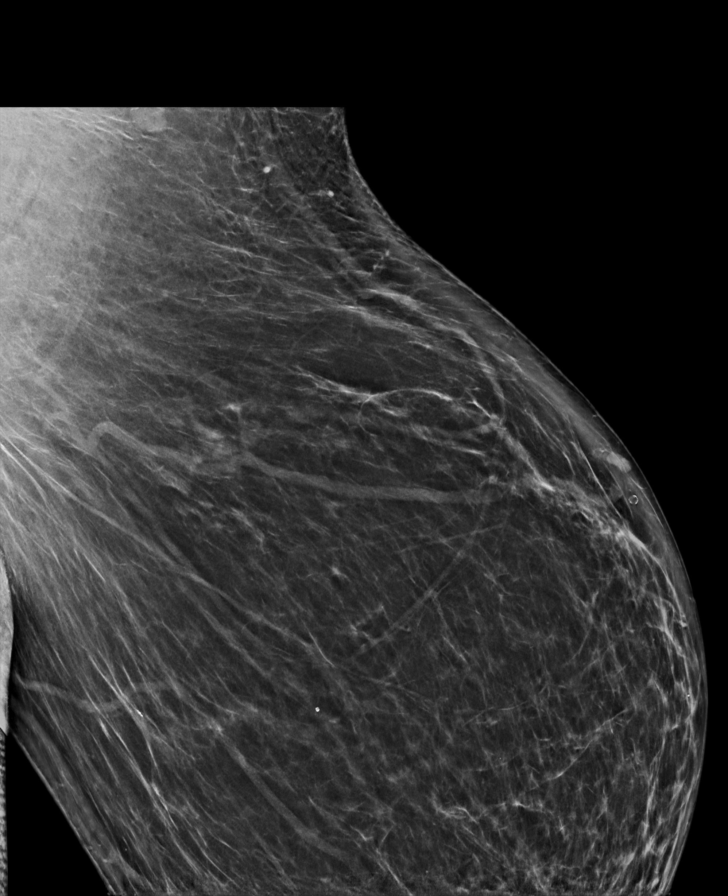

[R MLO tomo · tomo slice 61/89.0]
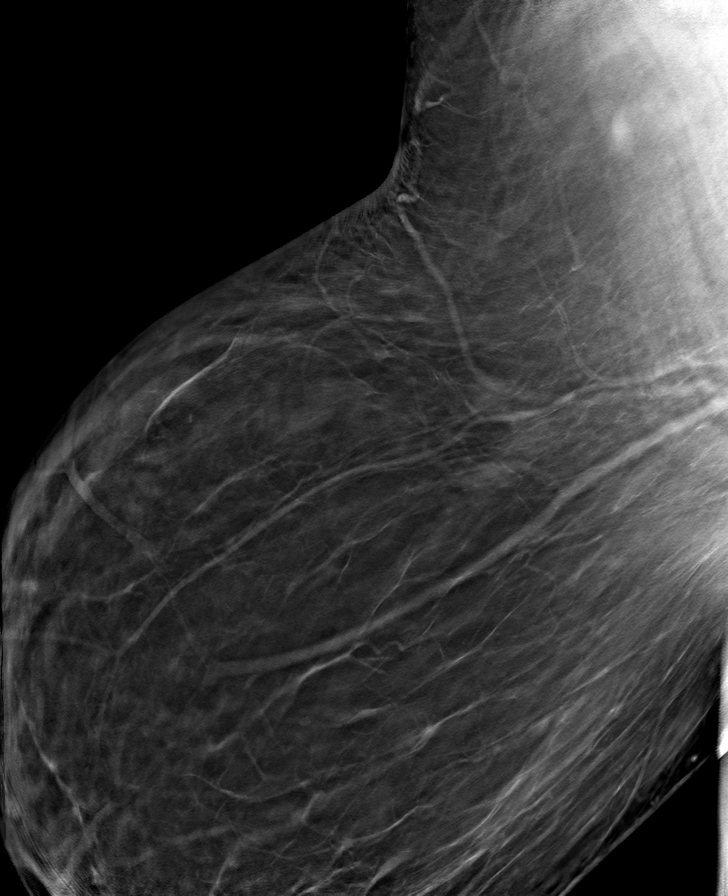

[8 of 40 positions shown; findings below may reference images not displayed]

ACR Breast Density Category b: There are scattered areas of
fibroglandular density.
FINDINGS: There are no findings suspicious for malignancy. Images were
processed with CAD.
IMPRESSION: No mammographic evidence of malignancy. A result letter of this
screening mammogram will be mailed directly to the patient.

RECOMMENDATION:
Screening mammogram in one year. (Code:[id number])

BI-RADS CATEGORY  1: Negative.

## 2022-09-05 ENCOUNTER — Other Ambulatory Visit: Payer: Self-pay | Admitting: Family Medicine

## 2022-09-06 ENCOUNTER — Other Ambulatory Visit: Payer: Self-pay | Admitting: Family Medicine

## 2022-09-09 NOTE — Telephone Encounter (Signed)
Needs appt

## 2022-09-22 ENCOUNTER — Encounter: Payer: Self-pay | Admitting: Family Medicine

## 2022-09-22 ENCOUNTER — Ambulatory Visit (INDEPENDENT_AMBULATORY_CARE_PROVIDER_SITE_OTHER): Payer: BC Managed Care – PPO | Admitting: Family Medicine

## 2022-09-22 VITALS — BP 116/73 | HR 74 | Temp 98.0°F | Ht 66.0 in | Wt 276.8 lb

## 2022-09-22 DIAGNOSIS — E1169 Type 2 diabetes mellitus with other specified complication: Secondary | ICD-10-CM | POA: Diagnosis not present

## 2022-09-22 DIAGNOSIS — E1165 Type 2 diabetes mellitus with hyperglycemia: Secondary | ICD-10-CM | POA: Diagnosis not present

## 2022-09-22 DIAGNOSIS — I10 Essential (primary) hypertension: Secondary | ICD-10-CM

## 2022-09-22 DIAGNOSIS — R600 Localized edema: Secondary | ICD-10-CM

## 2022-09-22 DIAGNOSIS — E785 Hyperlipidemia, unspecified: Secondary | ICD-10-CM

## 2022-09-22 LAB — CBC WITH DIFFERENTIAL/PLATELET
Basophils Absolute: 0 10*3/uL (ref 0.0–0.1)
Basophils Relative: 0.4 % (ref 0.0–3.0)
Eosinophils Absolute: 0.1 10*3/uL (ref 0.0–0.7)
Eosinophils Relative: 1.8 % (ref 0.0–5.0)
HCT: 35 % — ABNORMAL LOW (ref 36.0–46.0)
Hemoglobin: 11.6 g/dL — ABNORMAL LOW (ref 12.0–15.0)
Lymphocytes Relative: 21.4 % (ref 12.0–46.0)
Lymphs Abs: 0.8 10*3/uL (ref 0.7–4.0)
MCHC: 33 g/dL (ref 30.0–36.0)
MCV: 78.8 fl (ref 78.0–100.0)
Monocytes Absolute: 0.3 10*3/uL (ref 0.1–1.0)
Monocytes Relative: 8.6 % (ref 3.0–12.0)
Neutro Abs: 2.5 10*3/uL (ref 1.4–7.7)
Neutrophils Relative %: 67.8 % (ref 43.0–77.0)
Platelets: 148 10*3/uL — ABNORMAL LOW (ref 150.0–400.0)
RBC: 4.44 Mil/uL (ref 3.87–5.11)
RDW: 18.5 % — ABNORMAL HIGH (ref 11.5–15.5)
WBC: 3.7 10*3/uL — ABNORMAL LOW (ref 4.0–10.5)

## 2022-09-22 LAB — LIPID PANEL
Cholesterol: 178 mg/dL (ref 0–200)
HDL: 47.9 mg/dL (ref >39.00)
LDL Cholesterol: 115 mg/dL — ABNORMAL HIGH (ref 0–99)
NonHDL: 129.98
Total CHOL/HDL Ratio: 4
Triglycerides: 74 mg/dL (ref 0.0–149.0)
VLDL: 14.8 mg/dL (ref 0.0–40.0)

## 2022-09-22 LAB — MICROALBUMIN / CREATININE URINE RATIO
Creatinine,U: 134.1 mg/dL
Microalb Creat Ratio: 1.1 mg/g (ref 0.0–30.0)
Microalb, Ur: 1.5 mg/dL (ref 0.0–1.9)

## 2022-09-22 LAB — COMPREHENSIVE METABOLIC PANEL
ALT: 13 U/L (ref 0–35)
AST: 11 U/L (ref 0–37)
Albumin: 3.9 g/dL (ref 3.5–5.2)
Alkaline Phosphatase: 71 U/L (ref 39–117)
BUN: 15 mg/dL (ref 6–23)
CO2: 30 mEq/L (ref 19–32)
Calcium: 8.8 mg/dL (ref 8.4–10.5)
Chloride: 101 mEq/L (ref 96–112)
Creatinine, Ser: 0.99 mg/dL (ref 0.40–1.20)
GFR: 64.97 mL/min (ref >60.00)
Glucose, Bld: 181 mg/dL — ABNORMAL HIGH (ref 70–99)
Potassium: 4.1 mEq/L (ref 3.5–5.1)
Sodium: 134 mEq/L — ABNORMAL LOW (ref 135–145)
Total Bilirubin: 0.4 mg/dL (ref 0.2–1.2)
Total Protein: 7.2 g/dL (ref 6.0–8.3)

## 2022-09-22 LAB — HEMOGLOBIN A1C: Hgb A1c MFr Bld: 9.7 % — ABNORMAL HIGH (ref 4.6–6.5)

## 2022-09-22 LAB — MAGNESIUM: Magnesium: 1.8 mg/dL (ref 1.5–2.5)

## 2022-09-22 LAB — TSH: TSH: 1.99 u[IU]/mL (ref 0.35–5.50)

## 2022-09-22 LAB — VITAMIN B12: Vitamin B-12: 464 pg/mL (ref 211–911)

## 2022-09-22 MED ORDER — GLIMEPIRIDE 2 MG PO TABS: 2.0000 mg | ORAL_TABLET | Freq: Every day | ORAL | 1 refills | Status: DC

## 2022-09-22 MED ORDER — FUROSEMIDE 20 MG PO TABS: 20.0000 mg | ORAL_TABLET | Freq: Every day | ORAL | 1 refills | Status: DC

## 2022-09-22 MED ORDER — OLMESARTAN MEDOXOMIL-HCTZ 40-25 MG PO TABS: 1.0000 | ORAL_TABLET | Freq: Every day | ORAL | 1 refills | Status: DC

## 2022-09-22 MED ORDER — METFORMIN HCL ER 500 MG PO TB24: 1000.0000 mg | ORAL_TABLET | Freq: Two times a day (BID) | ORAL | 1 refills | Status: DC

## 2022-09-22 MED ORDER — ATORVASTATIN CALCIUM 20 MG PO TABS: 20.0000 mg | ORAL_TABLET | Freq: Every day | ORAL | 1 refills | Status: DC

## 2022-09-22 MED ORDER — JARDIANCE 25 MG PO TABS: 25.0000 mg | ORAL_TABLET | Freq: Every day | ORAL | 1 refills | Status: DC

## 2022-09-22 NOTE — Progress Notes (Signed)
Subjective:     Patient ID: Wendy Sanford, female    DOB: 15-Aug-1970, 52 y.o.   MRN: [id number]  Chief Complaint  Patient presents with   Follow-up    Pt has no questions or concerns    Diabetes    HPI-patient has been out of medications or close to out so not taking regularly.  7min late  DM-type 2 diabetes mellitus - jardiance but not daily as running out.  Ran out of metformin 1 month(s) ago. Off glimepiride as thought jardiance to replace.  Not checking sugars. Ophth due Edema-taking furosemide most days. - swelling much better.  Still some swelling.  Doesn't want to take more as urinates a lot.  HYPERTENSION-Pt is on benicar hct.  Bp's running  not checking  No ha/dizziness/cp/palp/cough/sob  HLD-on atorvastatin-not run out yet(every other day taking it).  Health Maintenance Due  Topic Date Due   OPHTHALMOLOGY EXAM  Never done   HIV Screening  Never done   Hepatitis C Screening  Never done   PAP SMEAR-Modifier  Never done   COLONOSCOPY (Pts 45-3yrs Insurance coverage will need to be confirmed)  Never done   Zoster Vaccines- Shingrix (1 of 2) Never done   COVID-19 Vaccine (5 - 2023-24 season) 02/21/2022   MAMMOGRAM  04/18/2022    Past Medical History:  Diagnosis Date   Diabetes mellitus without complication    GERD (gastroesophageal reflux disease)    Hyperlipidemia    Hypertension     History reviewed. No pertinent surgical history.  Outpatient Medications Prior to Visit  Medication Sig Dispense Refill   chlorhexidine (PERIDEX) 0.12 % solution 15 mL by Oromucosal route Two (2) times a day.     fluticasone (FLONASE) 50 MCG/ACT nasal spray Place 1 spray into both nostrils daily. 16 g 1   glucose blood test strip 1 each by Other route daily. 100 each 3   METAMUCIL FIBER PO      omeprazole (PRILOSEC) 20 MG capsule Take by mouth.     atorvastatin (LIPITOR) 20 MG tablet TAKE ONE TABLET BY MOUTH DAILY 30 tablet 0   furosemide (LASIX) 20 MG tablet TAKE 1 TABLET BY  MOUTH DAILY 30 tablet 0   glimepiride (AMARYL) 2 MG tablet TAKE 1 TABLET BY MOUTH DAILY BEFORE BREAKFAST 30 tablet 0   JARDIANCE 25 MG TABS tablet Take 25 mg by mouth daily.     metFORMIN (GLUCOPHAGE-XR) 500 MG 24 hr tablet Take 1,000 mg by mouth 2 (two) times daily.     olmesartan-hydrochlorothiazide (BENICAR HCT) 40-25 MG tablet Take 1 tablet by mouth daily. 90 tablet 1   No facility-administered medications prior to visit.    Allergies  Allergen Reactions   Lisinopril Cough   ROS neg/noncontributory except as noted HPI/below  Skin is very dry-using aveno-not "soaking in well" Menses-more irreg.  Last week(s) but decreased flow.       Objective:     BP 116/73 (BP Location: Right Arm, Patient Position: Sitting)   Pulse 74   Temp 98 F (36.7 C) (Temporal)   Ht 5\' 6"  (1.676 m)   Wt 276 lb 12.8 oz (125.6 kg)   SpO2 100%   BMI 44.68 kg/m  Wt Readings from Last 3 Encounters:  09/22/22 276 lb 12.8 oz (125.6 kg)  12/13/21 282 lb 4 oz (128 kg)  08/30/21 282 lb (127.9 kg)    Physical Exam   Gen: WDWN NAD HEENT: NCAT, conjunctiva not injected, sclera nonicteric NECK:  supple, no  thyromegaly, no nodes, no carotid bruits CARDIAC: RRR, S1S2+, no murmur. DP 2+B LUNGS: CTAB. No wheezes ABDOMEN:  BS+, soft, NTND, No HSM, no masses EXT:   tense BLE edema w/hyperpigmentation  MSK: no gross abnormalities.  NEURO: A&O x3.  CN II-XII intact.  PSYCH: normal mood. Good eye contact     Assessment & Plan:   Problem List Items Addressed This Visit       Cardiovascular and Mediastinum   Primary hypertension   Relevant Medications   atorvastatin (LIPITOR) 20 MG tablet   furosemide (LASIX) 20 MG tablet   olmesartan-hydrochlorothiazide (BENICAR HCT) 40-25 MG tablet   Other Relevant Orders   Comprehensive metabolic panel (Completed)   Lipid panel (Completed)   CBC with Differential/Platelet (Completed)   Microalbumin / creatinine urine ratio (Completed)   Magnesium (Completed)      Endocrine   Type 2 diabetes mellitus with hyperglycemia, without long-term current use of insulin - Primary   Relevant Medications   atorvastatin (LIPITOR) 20 MG tablet   JARDIANCE 25 MG TABS tablet   metFORMIN (GLUCOPHAGE-XR) 500 MG 24 hr tablet   olmesartan-hydrochlorothiazide (BENICAR HCT) 40-25 MG tablet   glimepiride (AMARYL) 2 MG tablet   Other Relevant Orders   Comprehensive metabolic panel (Completed)   Hemoglobin A1c (Completed)   Lipid panel (Completed)   TSH (Completed)   Microalbumin / creatinine urine ratio (Completed)   Vitamin B12 (Completed)   Hyperlipidemia associated with type 2 diabetes mellitus   Relevant Medications   atorvastatin (LIPITOR) 20 MG tablet   furosemide (LASIX) 20 MG tablet   JARDIANCE 25 MG TABS tablet   metFORMIN (GLUCOPHAGE-XR) 500 MG 24 hr tablet   olmesartan-hydrochlorothiazide (BENICAR HCT) 40-25 MG tablet   glimepiride (AMARYL) 2 MG tablet     Other   Localized edema   Relevant Orders   Comprehensive metabolic panel (Completed)   Magnesium (Completed)  1.  Diabetes type 2-chronic.  Uncontrolled.  Suspect still hyperglycemia.  She has been running out of meds.  Trying to "stretch them out".  Thought Wendy Sanford was in place of glimepiride, so stopped taking that.  Educated patient on reasons for denying refill request.  She can call us and tell us she has an appointment in so many weeks and we will fill meds so she does not run out.  Needs to work on diet/exercise.  Renewed metformin 1000 mg twice daily, Jardiance 25 mg daily.  Restart glimepiride 2 mg daily as we need to get sugars controlled and there are some issues with cost of other oral meds and she would prefer not taking injectables.  Check CMP, A1c, lipids, TSH, B12, microalbumin creatinine ratio.  Advised to schedule ophthalmology exam.  Follow-up in 3 months 2.  Hypertension-chronic.  Well-controlled.  Continue Benicar HCT 40/25.  Check CBC, CMP 3.  Hyperlipidemia-chronic.  Was  well-controlled on atorvastatin 20 mg daily.  However, has been taking every other day as she was running out.  Check lipids, CMP 4.  Chronic lower extremity edema-much better on daily Lasix 20 mg.  Still present.  Patient is happy with progress.  She prefers not increasing medication as it causes her to urinate too much.  Check CMP, magnesium  46-month follow-up-Pap and diabetes.  She needs to get her mammogram scheduled.  Meds ordered this encounter  Medications   atorvastatin (LIPITOR) 20 MG tablet    Sig: Take 1 tablet (20 mg total) by mouth daily.    Dispense:  90 tablet    Refill:  1   furosemide (LASIX) 20 MG tablet    Sig: Take 1 tablet (20 mg total) by mouth daily.    Dispense:  90 tablet    Refill:  1   JARDIANCE 25 MG TABS tablet    Sig: Take 1 tablet (25 mg total) by mouth daily.    Dispense:  90 tablet    Refill:  1   metFORMIN (GLUCOPHAGE-XR) 500 MG 24 hr tablet    Sig: Take 2 tablets (1,000 mg total) by mouth 2 (two) times daily.    Dispense:  180 tablet    Refill:  1   olmesartan-hydrochlorothiazide (BENICAR HCT) 40-25 MG tablet    Sig: Take 1 tablet by mouth daily.    Dispense:  90 tablet    Refill:  1   glimepiride (AMARYL) 2 MG tablet    Sig: Take 1 tablet (2 mg total) by mouth daily before breakfast.    Dispense:  90 tablet    Refill:  1    Wellington Hampshire, MD

## 2022-09-22 NOTE — Patient Instructions (Signed)
It was very nice to see you today!  Restart meds   PLEASE NOTE:  If you had any lab tests please let us know if you have not heard back within a few days. You may see your results on MyChart before we have a chance to review them but we will give you a call once they are reviewed by [place]. If we ordered any referrals today, please let us know if you have not heard from their office within the next week.   Please try these tips to maintain a healthy lifestyle:  Eat most of your calories during the day when you are active. Eliminate processed foods including packaged sweets (pies, cakes, cookies), reduce intake of potatoes, white bread, white pasta, and white rice. Look for whole grain options, oat flour or almond flour.  Each meal should contain half fruits/vegetables, one quarter protein, and one quarter carbs (no bigger than a computer mouse).  Cut down on sweet beverages. This includes juice, soda, and sweet tea. Also watch fruit intake, though this is a healthier sweet option, it still contains natural sugar! Limit to 3 servings daily.  Drink at least 1 glass of water with each meal and aim for at least 8 glasses per day  Exercise at least 150 minutes every week.

## 2022-09-24 ENCOUNTER — Telehealth: Payer: Self-pay | Admitting: *Deleted

## 2022-09-24 ENCOUNTER — Encounter: Payer: Self-pay | Admitting: *Deleted

## 2022-09-24 NOTE — Telephone Encounter (Signed)
Patient notified of message below.

## 2022-09-24 NOTE — Progress Notes (Signed)
Labs are as expected given the fact that you are running out of medicines.  You definitely need to get back on them as it should be.  Also, do need the glimepiride as we discussed.  Work on diet/exercise as well.  Hopefully things will get in range within the next 3 months.

## 2022-09-24 NOTE — Telephone Encounter (Signed)
Called patient to go over lab results and she stated that she forgot to mention at her appointment that sometimes her fingers cramp and get stuck in one position sometimes and then causes the whole hand to hurt. Patient wondered if there was something in her blood work that could possibly explain why that happened. Patient stated it happens at least once a week. Patient advised that statins can cause cramping in joints. Please advise.

## 2022-12-29 ENCOUNTER — Ambulatory Visit: Payer: BC Managed Care – PPO | Admitting: Family Medicine

## 2023-03-05 ENCOUNTER — Encounter: Payer: Self-pay | Admitting: Family Medicine

## 2023-03-05 ENCOUNTER — Ambulatory Visit (INDEPENDENT_AMBULATORY_CARE_PROVIDER_SITE_OTHER): Payer: BC Managed Care – PPO | Admitting: Family Medicine

## 2023-03-05 VITALS — BP 140/80 | HR 72 | Temp 98.1°F | Resp 18 | Ht 66.0 in | Wt 284.5 lb

## 2023-03-05 DIAGNOSIS — E785 Hyperlipidemia, unspecified: Secondary | ICD-10-CM | POA: Diagnosis not present

## 2023-03-05 DIAGNOSIS — R600 Localized edema: Secondary | ICD-10-CM | POA: Diagnosis not present

## 2023-03-05 DIAGNOSIS — Z1211 Encounter for screening for malignant neoplasm of colon: Secondary | ICD-10-CM

## 2023-03-05 DIAGNOSIS — Z1159 Encounter for screening for other viral diseases: Secondary | ICD-10-CM | POA: Diagnosis not present

## 2023-03-05 DIAGNOSIS — Z7984 Long term (current) use of oral hypoglycemic drugs: Secondary | ICD-10-CM | POA: Diagnosis not present

## 2023-03-05 DIAGNOSIS — E1169 Type 2 diabetes mellitus with other specified complication: Secondary | ICD-10-CM | POA: Diagnosis not present

## 2023-03-05 DIAGNOSIS — I10 Essential (primary) hypertension: Secondary | ICD-10-CM

## 2023-03-05 DIAGNOSIS — E1165 Type 2 diabetes mellitus with hyperglycemia: Secondary | ICD-10-CM | POA: Diagnosis not present

## 2023-03-05 DIAGNOSIS — Z1212 Encounter for screening for malignant neoplasm of rectum: Secondary | ICD-10-CM

## 2023-03-05 LAB — LIPID PANEL
Cholesterol: 156 mg/dL (ref 0–200)
HDL: 52.9 mg/dL (ref >39.00)
LDL Cholesterol: 89 mg/dL (ref 0–99)
NonHDL: 103.49
Total CHOL/HDL Ratio: 3
Triglycerides: 70 mg/dL (ref 0.0–149.0)
VLDL: 14 mg/dL (ref 0.0–40.0)

## 2023-03-05 LAB — COMPREHENSIVE METABOLIC PANEL
ALT: 16 U/L (ref 0–35)
AST: 14 U/L (ref 0–37)
Albumin: 3.8 g/dL (ref 3.5–5.2)
Alkaline Phosphatase: 72 U/L (ref 39–117)
BUN: 14 mg/dL (ref 6–23)
CO2: 30 meq/L (ref 19–32)
Calcium: 9.2 mg/dL (ref 8.4–10.5)
Chloride: 100 meq/L (ref 96–112)
Creatinine, Ser: 0.93 mg/dL (ref 0.40–1.20)
GFR: 69.81 mL/min (ref >60.00)
Glucose, Bld: 241 mg/dL — ABNORMAL HIGH (ref 70–99)
Potassium: 4.3 meq/L (ref 3.5–5.1)
Sodium: 138 meq/L (ref 135–145)
Total Bilirubin: 0.5 mg/dL (ref 0.2–1.2)
Total Protein: 7.4 g/dL (ref 6.0–8.3)

## 2023-03-05 LAB — CBC WITH DIFFERENTIAL/PLATELET
Basophils Absolute: 0 10*3/uL (ref 0.0–0.1)
Basophils Relative: 0.8 % (ref 0.0–3.0)
Eosinophils Absolute: 0.1 10*3/uL (ref 0.0–0.7)
Eosinophils Relative: 2.1 % (ref 0.0–5.0)
HCT: 36.4 % (ref 36.0–46.0)
Hemoglobin: 11.8 g/dL — ABNORMAL LOW (ref 12.0–15.0)
Lymphocytes Relative: 22.2 % (ref 12.0–46.0)
Lymphs Abs: 0.8 10*3/uL (ref 0.7–4.0)
MCHC: 32.5 g/dL (ref 30.0–36.0)
MCV: 80.9 fl (ref 78.0–100.0)
Monocytes Absolute: 0.3 10*3/uL (ref 0.1–1.0)
Monocytes Relative: 9 % (ref 3.0–12.0)
Neutro Abs: 2.5 10*3/uL (ref 1.4–7.7)
Neutrophils Relative %: 65.9 % (ref 43.0–77.0)
Platelets: 150 10*3/uL (ref 150.0–400.0)
RBC: 4.5 Mil/uL (ref 3.87–5.11)
RDW: 17.3 % — ABNORMAL HIGH (ref 11.5–15.5)
WBC: 3.8 10*3/uL — ABNORMAL LOW (ref 4.0–10.5)

## 2023-03-05 LAB — MICROALBUMIN / CREATININE URINE RATIO
Creatinine,U: 159.2 mg/dL
Microalb Creat Ratio: 4.9 mg/g (ref 0.0–30.0)
Microalb, Ur: 7.8 mg/dL — ABNORMAL HIGH (ref 0.0–1.9)

## 2023-03-05 LAB — HEMOGLOBIN A1C: Hgb A1c MFr Bld: 9.2 % — ABNORMAL HIGH (ref 4.6–6.5)

## 2023-03-05 LAB — VITAMIN B12: Vitamin B-12: 556 pg/mL (ref 211–911)

## 2023-03-05 MED ORDER — OLMESARTAN MEDOXOMIL-HCTZ 40-25 MG PO TABS: 1.0000 | ORAL_TABLET | Freq: Every day | ORAL | 1 refills | Status: AC

## 2023-03-05 MED ORDER — JARDIANCE 25 MG PO TABS
25.0000 mg | ORAL_TABLET | Freq: Every day | ORAL | 1 refills | Status: DC
Start: 2023-03-05 — End: 2023-08-29

## 2023-03-05 MED ORDER — DEXCOM G7 SENSOR MISC: 1.0000 | 3 refills | Status: AC

## 2023-03-05 MED ORDER — ATORVASTATIN CALCIUM 20 MG PO TABS
20.0000 mg | ORAL_TABLET | Freq: Every day | ORAL | 1 refills | Status: AC
Start: 2023-03-05 — End: ?

## 2023-03-05 MED ORDER — METFORMIN HCL ER 500 MG PO TB24: 1000.0000 mg | ORAL_TABLET | Freq: Two times a day (BID) | ORAL | 1 refills | Status: AC

## 2023-03-05 MED ORDER — GLIMEPIRIDE 2 MG PO TABS
2.0000 mg | ORAL_TABLET | Freq: Every day | ORAL | 1 refills | Status: DC
Start: 2023-03-05 — End: 2023-04-26

## 2023-03-05 NOTE — Progress Notes (Signed)
Subjective:     Patient ID: Wendy Sanford, female    DOB: [birth year], 52 y.o.   MRN: [id number]  Chief Complaint  Patient presents with   Medical Management of Chronic Issues    3 month follow-up dm Discuss starting a nutrition program, needed an A1C within last 6 months Fasting Discuss when to take medications    HPI  DM - She is on Jardiance 25 mg and glimepiride 2 mg daily. She has not been taking her metformin 500 mg, stating the tablets are too large for her. She has not been monitoring her sugars on a regular basis, sometimes can go longer than 3 months without checking-has a needle phobia. She is interested in a new model of monitor for checking her sugars, since her current monitor hurts when pricking her finger. She has not been able to exercise regularly due to different life stressors that have occupying most of her time. She has joined a group for diabetes, but has not seen the nutritionist in a while. Is signed up for diabetic meal plans through [employer]. She is well overdue for a routine eye exam, was meant to have her last appt in 05/2022 but wasn't able to make her appt.   HTN - Pt is on benicar-hydrochlorothiazide 40-25 mg daily. She has only been monitoring her BP at home when she has constant headaches. Her episodes of constant headaches only occur occasionally. No dizziness/cp/palp/cough/sob.  HLD - Taking Lipitor 20 mg daily.   Edema - Taking Lasix 20 mg daily. She has been wearing her compression socks, which have helped with her swelling. She notes that if she is not wearing her compression socks then her feet/toes will swell within a couple of hours. Had echo and doppler for dvt in past  Colon cancer screening - She has not had a colonoscopy yet. She's more receptive to trying the cologuard. No known fmhx of colon cancer.   Immunizations - She is UTD on both shingles vaccines. She has not gotten this season's flu and Covid vaccine yet, but plans  to soon.   She has not scheduled her mammogram yet. States it was difficult for her to schedule her mammogram that was due last year.    Health Maintenance Due  Topic Date Due   HIV Screening  Never done   Hepatitis C Screening  Never done   PAP SMEAR-Modifier  Never done   Colonoscopy  Never done   MAMMOGRAM  04/18/2022    Past Medical History:  Diagnosis Date   Diabetes mellitus without complication (HCC)    GERD (gastroesophageal reflux disease)    Hyperlipidemia    Hypertension     History reviewed. No pertinent surgical history.   Current Outpatient Medications:    chlorhexidine (PERIDEX) 0.12 % solution, as needed., Disp: , Rfl:    Continuous Glucose Sensor (DEXCOM G7 SENSOR) MISC, 1 each by Does not apply route every 14 (fourteen) days., Disp: 6 each, Rfl: 3   fluticasone (FLONASE) 50 MCG/ACT nasal spray, Place 1 spray into both nostrils daily., Disp: 16 g, Rfl: 1   furosemide (LASIX) 20 MG tablet, Take 1 tablet (20 mg total) by mouth daily. (Patient taking differently: Take 20 mg by mouth as needed.), Disp: 90 tablet, Rfl: 1   glucose blood test strip, 1 each by Other route daily., Disp: 100 each, Rfl: 3   METAMUCIL FIBER PO, , Disp: , Rfl:    omeprazole (PRILOSEC) 20 MG capsule, Take by  mouth as needed., Disp: , Rfl:    atorvastatin (LIPITOR) 20 MG tablet, Take 1 tablet (20 mg total) by mouth daily., Disp: 90 tablet, Rfl: 1   glimepiride (AMARYL) 2 MG tablet, Take 1 tablet (2 mg total) by mouth daily before breakfast., Disp: 90 tablet, Rfl: 1   JARDIANCE 25 MG TABS tablet, Take 1 tablet (25 mg total) by mouth daily., Disp: 90 tablet, Rfl: 1   metFORMIN (GLUCOPHAGE-XR) 500 MG 24 hr tablet, Take 2 tablets (1,000 mg total) by mouth 2 (two) times daily., Disp: 360 tablet, Rfl: 1   olmesartan-hydrochlorothiazide (BENICAR HCT) 40-25 MG tablet, Take 1 tablet by mouth daily., Disp: 90 tablet, Rfl: 1  Allergies  Allergen Reactions   Lisinopril Cough   ROS  neg/noncontributory except as noted HPI/below      Objective:     BP (!) 140/80 (BP Location: Left Arm, Patient Position: Sitting, Cuff Size: Large)   Pulse 72   Temp 98.1 F (36.7 C) (Temporal)   Resp 18   Ht 5\' 6"  (1.676 m)   Wt 284 lb 8 oz (129 kg)   SpO2 99%   BMI 45.92 kg/m  Wt Readings from Last 3 Encounters:  03/05/23 284 lb 8 oz (129 kg)  09/22/22 276 lb 12.8 oz (125.6 kg)  12/13/21 282 lb 4 oz (128 kg)    Physical Exam   Gen: WDWN NAD HEENT: NCAT, conjunctiva not injected, sclera nonicteric NECK:  supple, no thyromegaly, no nodes, no carotid bruits CARDIAC: RRR, S1S2+, no murmur. DP 2+B LUNGS: CTAB. No wheezes ABDOMEN:  BS+, soft, NTND, No HSM, no masses EXT:  2+ edema  MSK: no gross abnormalities.  NEURO: A&O x3.  CN II-XII intact.  PSYCH: normal mood. Good eye contact   +Educated pt on how to correctly use BP cuff.     Assessment & Plan:  Type 2 diabetes mellitus with hyperglycemia, without long-term current use of insulin (HCC) Assessment & Plan: Chronic.  Not controlled.  Pt not taking metformin regularly.  Discussed need to.  Continue jardiance 25mg , glimiperide 2mg  and compliant w/metformin 1000mg  bid.  Get ophth sch.    Orders: -     Comprehensive metabolic panel -     Hemoglobin A1c -     Microalbumin / creatinine urine ratio -     Glimepiride; Take 1 tablet (2 mg total) by mouth daily before breakfast.  Dispense: 90 tablet; Refill: 1 -     Jardiance; Take 1 tablet (25 mg total) by mouth daily.  Dispense: 90 tablet; Refill: 1 -     metFORMIN HCl ER; Take 2 tablets (1,000 mg total) by mouth 2 (two) times daily.  Dispense: 360 tablet; Refill: 1 -     Dexcom G7 Sensor; 1 each by Does not apply route every 14 (fourteen) days.  Dispense: 6 each; Refill: 3  Primary hypertension Assessment & Plan: Chronic.  Not ideal control.  Continue benicar hct 40/25.  Monitor bp's at least monthly.   Orders: -     Comprehensive metabolic panel -     CBC with  Differential/Platelet -     Olmesartan Medoxomil-HCTZ; Take 1 tablet by mouth daily.  Dispense: 90 tablet; Refill: 1  Hyperlipidemia associated with type 2 diabetes mellitus (HCC) Assessment & Plan: Chronic.  Not controlled but wasn't on meds.  Continue lipitor 20mg   Orders: -     Comprehensive metabolic panel -     Lipid panel -     Atorvastatin Calcium; Take 1  tablet (20 mg total) by mouth daily.  Dispense: 90 tablet; Refill: 1  Localized edema Assessment & Plan: Chronic. Not controlled unless wearing compression stockings.  Educated on mechanism and control factors.  Doesn't want procedures done.  Work on exercise. Lasix daily prn   Long term current use of oral hypoglycemic drug -     Vitamin B12  Screening for viral disease -     Hepatitis C antibody -     HIV Antibody (routine testing w rflx)  Screening for colorectal cancer -     Cologuard    Return in about 3 months (around 06/04/2023) for chronic follow-up and pap.   I,Rachel Rivera,acting as a scribe for Angelena Sole, MD.,have documented all relevant documentation on the behalf of Angelena Sole, MD,as directed by  Angelena Sole, MD while in the presence of Angelena Sole, MD.  I, Angelena Sole, MD, have reviewed all documentation for this visit. The documentation on 03/05/23 for the exam, diagnosis, procedures, and orders are all accurate and complete.    Angelena Sole, MD

## 2023-03-05 NOTE — Progress Notes (Signed)
Cholesterol ok-same meds Sugars-still same-bad.  Since she doesn't like taking the metformin(should), increase the glimepiride to 4mg  daily(send new rx)

## 2023-03-05 NOTE — Assessment & Plan Note (Signed)
Chronic.  Not ideal control.  Continue benicar hct 40/25.  Monitor bp's at least monthly.

## 2023-03-05 NOTE — Assessment & Plan Note (Signed)
Chronic.  Not controlled but wasn't on meds.  Continue lipitor 20mg 

## 2023-03-05 NOTE — Patient Instructions (Signed)

## 2023-03-05 NOTE — Assessment & Plan Note (Signed)
Chronic.  Not controlled.  Pt not taking metformin regularly.  Discussed need to.  Continue jardiance 25mg , glimiperide 2mg  and compliant w/metformin 1000mg  bid.  Get ophth sch.

## 2023-03-05 NOTE — Assessment & Plan Note (Signed)
Chronic. Not controlled unless wearing compression stockings.  Educated on mechanism and control factors.  Doesn't want procedures done.  Work on exercise. Lasix daily prn

## 2023-03-06 ENCOUNTER — Encounter: Payer: Self-pay | Admitting: *Deleted

## 2023-03-06 ENCOUNTER — Other Ambulatory Visit: Payer: Self-pay | Admitting: *Deleted

## 2023-03-06 LAB — HIV ANTIBODY (ROUTINE TESTING W REFLEX): HIV 1&2 Ab, 4th Generation: NONREACTIVE

## 2023-03-06 LAB — HEPATITIS C ANTIBODY: Hepatitis C Ab: NONREACTIVE

## 2023-03-06 MED ORDER — GLIMEPIRIDE 4 MG PO TABS: 4.0000 mg | ORAL_TABLET | Freq: Every day | ORAL | 1 refills | Status: DC

## 2023-04-14 ENCOUNTER — Other Ambulatory Visit: Payer: Self-pay | Admitting: Family Medicine

## 2023-04-14 ENCOUNTER — Telehealth: Payer: Self-pay

## 2023-04-14 ENCOUNTER — Other Ambulatory Visit (HOSPITAL_COMMUNITY): Payer: Self-pay

## 2023-04-14 DIAGNOSIS — Z1231 Encounter for screening mammogram for malignant neoplasm of breast: Secondary | ICD-10-CM

## 2023-04-14 NOTE — Telephone Encounter (Signed)
Pharmacy Patient Advocate Encounter   Received notification from Patient Pharmacy that prior authorization for Dexcom G7 Sensor is required/requested.   Insurance verification completed.   The patient is insured through [facility] .   Per test claim: PA required; PA started via CoverMyMeds. KEY [id number] . Waiting for clinical questions to populate.

## 2023-04-16 NOTE — Telephone Encounter (Signed)
Pharmacy Patient Advocate Encounter  Questions generated, answered, and submitted

## 2023-04-21 NOTE — Telephone Encounter (Signed)
Pharmacy Patient Advocate Encounter  Received notification from [facility] that Prior Authorization for Dexcom G7 Sensor has been DENIED.  Full denial letter will be uploaded to the media tab. See denial reason below.  This device is covered when a member uses insulin to manage their diabetes (a condition with high blood sugar). In this case, the member is not using insulin for their diabetes.  PA #/Case ID/Reference #: [id number]  Please be advised we currently do not have a Pharmacist to review denials, therefore you will need to process appeals accordingly as needed. Thanks for your support at this time. Contact for appeals are as follows: Phone: [phone] ext [phone], Fax: [phone]

## 2023-04-22 NOTE — Telephone Encounter (Signed)
Noted  

## 2023-04-25 ENCOUNTER — Other Ambulatory Visit: Payer: Self-pay | Admitting: Family Medicine

## 2023-04-26 ENCOUNTER — Other Ambulatory Visit: Payer: Self-pay | Admitting: Family Medicine

## 2023-05-03 ENCOUNTER — Other Ambulatory Visit: Payer: Self-pay | Admitting: Family Medicine

## 2023-05-11 ENCOUNTER — Ambulatory Visit
Admission: RE | Admit: 2023-05-11 | Discharge: 2023-05-11 | Disposition: A | Discharge status: Home / Self Care | Payer: BC Managed Care – PPO | Source: Ambulatory Visit | Attending: Family Medicine | Admitting: Family Medicine

## 2023-05-11 DIAGNOSIS — Z1231 Encounter for screening mammogram for malignant neoplasm of breast: Secondary | ICD-10-CM

## 2023-06-04 ENCOUNTER — Ambulatory Visit: Payer: BC Managed Care – PPO | Admitting: Family Medicine

## 2023-06-05 ENCOUNTER — Ambulatory Visit: Payer: BC Managed Care – PPO | Admitting: Family Medicine

## 2023-06-11 ENCOUNTER — Encounter: Payer: Self-pay | Admitting: Family Medicine

## 2023-06-11 ENCOUNTER — Other Ambulatory Visit (HOSPITAL_COMMUNITY)
Admission: RE | Admit: 2023-06-11 | Discharge: 2023-06-11 | Disposition: A | Discharge status: Home / Self Care | Payer: BC Managed Care – PPO | Source: Ambulatory Visit | Attending: Family Medicine | Admitting: Family Medicine

## 2023-06-11 ENCOUNTER — Ambulatory Visit: Payer: BC Managed Care – PPO | Admitting: Family Medicine

## 2023-06-11 VITALS — BP 156/84 | HR 68 | Temp 98.1°F | Resp 18 | Ht 66.0 in | Wt 280.5 lb

## 2023-06-11 DIAGNOSIS — Z124 Encounter for screening for malignant neoplasm of cervix: Secondary | ICD-10-CM | POA: Diagnosis not present

## 2023-06-11 DIAGNOSIS — E785 Hyperlipidemia, unspecified: Secondary | ICD-10-CM

## 2023-06-11 DIAGNOSIS — R600 Localized edema: Secondary | ICD-10-CM

## 2023-06-11 DIAGNOSIS — E1169 Type 2 diabetes mellitus with other specified complication: Secondary | ICD-10-CM

## 2023-06-11 DIAGNOSIS — N760 Acute vaginitis: Secondary | ICD-10-CM | POA: Diagnosis not present

## 2023-06-11 DIAGNOSIS — Z113 Encounter for screening for infections with a predominantly sexual mode of transmission: Secondary | ICD-10-CM | POA: Insufficient documentation

## 2023-06-11 DIAGNOSIS — E1165 Type 2 diabetes mellitus with hyperglycemia: Secondary | ICD-10-CM | POA: Diagnosis not present

## 2023-06-11 DIAGNOSIS — Z7984 Long term (current) use of oral hypoglycemic drugs: Secondary | ICD-10-CM | POA: Diagnosis not present

## 2023-06-11 DIAGNOSIS — I10 Essential (primary) hypertension: Secondary | ICD-10-CM

## 2023-06-11 DIAGNOSIS — B3731 Acute candidiasis of vulva and vagina: Secondary | ICD-10-CM | POA: Diagnosis not present

## 2023-06-11 DIAGNOSIS — R109 Unspecified abdominal pain: Secondary | ICD-10-CM

## 2023-06-11 DIAGNOSIS — B9689 Other specified bacterial agents as the cause of diseases classified elsewhere: Secondary | ICD-10-CM | POA: Insufficient documentation

## 2023-06-11 DIAGNOSIS — R252 Cramp and spasm: Secondary | ICD-10-CM

## 2023-06-11 LAB — MAGNESIUM: Magnesium: 1.6 mg/dL (ref 1.5–2.5)

## 2023-06-11 LAB — CBC WITH DIFFERENTIAL/PLATELET
Basophils Absolute: 0 10*3/uL (ref 0.0–0.1)
Basophils Relative: 0.7 % (ref 0.0–3.0)
Eosinophils Absolute: 0.1 10*3/uL (ref 0.0–0.7)
Eosinophils Relative: 2.2 % (ref 0.0–5.0)
HCT: 35.2 % — ABNORMAL LOW (ref 36.0–46.0)
Hemoglobin: 11.5 g/dL — ABNORMAL LOW (ref 12.0–15.0)
Lymphocytes Relative: 22.9 % (ref 12.0–46.0)
Lymphs Abs: 0.7 10*3/uL (ref 0.7–4.0)
MCHC: 32.8 g/dL (ref 30.0–36.0)
MCV: 82.1 fL (ref 78.0–100.0)
Monocytes Absolute: 0.3 10*3/uL (ref 0.1–1.0)
Monocytes Relative: 8 % (ref 3.0–12.0)
Neutro Abs: 2.1 10*3/uL (ref 1.4–7.7)
Neutrophils Relative %: 66.2 % (ref 43.0–77.0)
Platelets: 138 10*3/uL — ABNORMAL LOW (ref 150.0–400.0)
RBC: 4.28 Mil/uL (ref 3.87–5.11)
RDW: 16.4 % — ABNORMAL HIGH (ref 11.5–15.5)
WBC: 3.2 10*3/uL — ABNORMAL LOW (ref 4.0–10.5)

## 2023-06-11 LAB — COMPREHENSIVE METABOLIC PANEL
ALT: 15 U/L (ref 0–35)
AST: 13 U/L (ref 0–37)
Albumin: 4 g/dL (ref 3.5–5.2)
Alkaline Phosphatase: 73 U/L (ref 39–117)
BUN: 14 mg/dL (ref 6–23)
CO2: 30 meq/L (ref 19–32)
Calcium: 9.3 mg/dL (ref 8.4–10.5)
Chloride: 99 meq/L (ref 96–112)
Creatinine, Ser: 0.88 mg/dL (ref 0.40–1.20)
GFR: 74.45 mL/min (ref >60.00)
Glucose, Bld: 254 mg/dL — ABNORMAL HIGH (ref 70–99)
Potassium: 4.2 meq/L (ref 3.5–5.1)
Sodium: 134 meq/L — ABNORMAL LOW (ref 135–145)
Total Bilirubin: 0.4 mg/dL (ref 0.2–1.2)
Total Protein: 7.5 g/dL (ref 6.0–8.3)

## 2023-06-11 LAB — HEMOGLOBIN A1C: Hgb A1c MFr Bld: 9.5 % — ABNORMAL HIGH (ref 4.6–6.5)

## 2023-06-11 MED ORDER — AMLODIPINE BESYLATE 5 MG PO TABS: 5.0000 mg | ORAL_TABLET | Freq: Every day | ORAL | 1 refills | Status: AC

## 2023-06-11 NOTE — Progress Notes (Signed)
Sugars are worse.  Verify dose she's taking of glimiperide.  She should add in the metformin again.  Wbc slightly lower.  We will discuss more at appt in few weeks.  Also, remind to take the electrolytes daily and can add otc magnesium 200-400mg  daily

## 2023-06-11 NOTE — Assessment & Plan Note (Signed)
Chronic.  Not controlled.  Continue benicar hct 40/25.  Add amlodipine 5mg .

## 2023-06-11 NOTE — Progress Notes (Signed)
Subjective:     Patient ID: Wendy Sanford, female    DOB: 16-Dec-1970, 52 y.o.   MRN: [id number]  Chief Complaint  Patient presents with   Medical Management of Chronic Issues    3 month follow-up When wiping having sensitivity x 1 week Fasting Finger pain getting worse and frequent, fingers getting stuck in placed for a while Abdominal spasms started up again   Gynecologic Exam    HPI  DM - She is on Jardiance 25 mg and glimepiride 4 mg daily(taking 2 of the 2mg  but I sent in 4mg  so not sure what she's taking). She has not been taking her metformin 500 mg, stating the tablets are too large for her. She has not been monitoring her sugars on a regular basis, has a needle phobia. She is interested in a new model of monitor for checking her sugars, since her current monitor hurts when pricking her finger.  I sent the dexcom to pharm, she hasn't gotten it. She has not been able to exercise regularly due to different life stressors that have occupying most of her time. She has joined a group for diabetes, but has not seen the nutritionist in a while. Is signed up for diabetic meal plans through [employer].  Will be seeing nutritionist. Will do NOOM in January as well.  She is well overdue for a routine eye exam, was meant to have her last appt in 05/2022 but wasn't able to make her appt  HTN - Pt is on benicar-hydrochlorothiazide 40-25 mg daily.No dizziness/cp/palp/cough/sob.  HLD - Taking Lipitor 20 mg daily.   Edema - Taking Lasix 20 mg but not daily as urinates so much. She has been wearing her compression socks, which have helped with her swelling. She notes that if she is not wearing her compression socks then her feet/toes will swell within a couple of hours. Had echo and doppler for dvt in past 5.  Fingers intermitt-can lock in place(spasm-straight out) and pain to bend. Better after drinks electrolytes. 6.  When wiping, for 1 wk, "sensitive".  No dysuria. No d/c  7.   Pap-no abn pap.  Last unk.  Only some spotting intermitt for menses.    Health Maintenance Due  Topic Date Due   OPHTHALMOLOGY EXAM  Never done   Colonoscopy  Never done   FOOT EXAM  04/22/2023   Cervical Cancer Screening (HPV/Pap Cotest)  06/05/2023    Past Medical History:  Diagnosis Date   Diabetes mellitus without complication (HCC)    GERD (gastroesophageal reflux disease)    Hyperlipidemia    Hypertension     History reviewed. No pertinent surgical history.   Current Outpatient Medications:    amLODipine (NORVASC) 5 MG tablet, Take 1 tablet (5 mg total) by mouth daily., Disp: 30 tablet, Rfl: 1   atorvastatin (LIPITOR) 20 MG tablet, Take 1 tablet (20 mg total) by mouth daily., Disp: 90 tablet, Rfl: 1   chlorhexidine (PERIDEX) 0.12 % solution, as needed., Disp: , Rfl:    Continuous Glucose Sensor (DEXCOM G7 SENSOR) MISC, 1 each by Does not apply route every 14 (fourteen) days., Disp: 6 each, Rfl: 3   fluticasone (FLONASE) 50 MCG/ACT nasal spray, Place 1 spray into both nostrils daily., Disp: 16 g, Rfl: 1   furosemide (LASIX) 20 MG tablet, Take 1 tablet (20 mg total) by mouth daily as needed for edema., Disp: 30 tablet, Rfl: 1   glimepiride (AMARYL) 4 MG tablet, TAKE 1 TABLET  BY MOUTH DAILY BEFORE BREAKFAST, Disp: 30 tablet, Rfl: 1   glucose blood test strip, 1 each by Other route daily., Disp: 100 each, Rfl: 3   JARDIANCE 25 MG TABS tablet, Take 1 tablet (25 mg total) by mouth daily., Disp: 90 tablet, Rfl: 1   METAMUCIL FIBER PO, , Disp: , Rfl:    metFORMIN (GLUCOPHAGE-XR) 500 MG 24 hr tablet, Take 2 tablets (1,000 mg total) by mouth 2 (two) times daily., Disp: 360 tablet, Rfl: 1   olmesartan-hydrochlorothiazide (BENICAR HCT) 40-25 MG tablet, Take 1 tablet by mouth daily., Disp: 90 tablet, Rfl: 1   omeprazole (PRILOSEC) 20 MG capsule, Take by mouth as needed., Disp: , Rfl:   Allergies  Allergen Reactions   Lisinopril Cough   ROS neg/noncontributory except as noted  HPI/below      Objective:     BP (!) 156/84 (BP Location: Left Arm, Patient Position: Sitting, Cuff Size: Large)   Pulse 68   Temp 98.1 F (36.7 C) (Temporal)   Resp 18   Ht 5\' 6"  (1.676 m)   Wt 280 lb 8 oz (127.2 kg)   SpO2 100%   BMI 45.27 kg/m  Wt Readings from Last 3 Encounters:  06/11/23 280 lb 8 oz (127.2 kg)  03/05/23 284 lb 8 oz (129 kg)  09/22/22 276 lb 12.8 oz (125.6 kg)    Physical Exam   Gen: WDWN NAD HEENT: NCAT, conjunctiva not injected, sclera nonicteric NECK:  supple, no thyromegaly, no nodes, no carotid bruits CARDIAC: RRR, S1S2+, no murmur. DP 2+B LUNGS: CTAB. No wheezes ABDOMEN:  BS+, soft, NTND, No HSM, no masses EXT:  1+ edema  MSK: no gross abnormalities.  NEURO: A&O x3.  CN II-XII intact.  PSYCH: normal mood. Good eye contact  Breasts: Examined lying-very large.              Right:   Without masses, retractions,  nipple discharge or axillary adenopathy.               Left:     Without masses, retractions, nipple discharge or axillary adenopathy. Genitourinary              Inguinal/mons:  Normal without inguinal adenopathy             External genitalia:  Normal appearing vulva with no masses, tenderness, or lesions but a little emacerated             BUS/Urethra/Skene's glands:  Normal             Vagina:  Normal appearing with normal color and discharge, no lesions             Cervix:  Normal appearing without discharge or lesions             Uterus:  Normal in size, shape and contour.  Midline and mobile, nontender             Adnexa/parametria:                           Rt:        Normal in size, without masses or tenderness.                         Lt:        Normal in size, without masses or tenderness.             Anus and perineum:  Normal  Chaperone present QJ  w/pt addressing mult issues, adjusting meds, doing pap.     Assessment & Plan:  Type 2 diabetes mellitus with hyperglycemia, without long-term current use of insulin  (HCC) Assessment & Plan: Chronic.  Not controlled.  Pt not taking metformin.   Continue jardiance 25mg , glimiperide 4mg (needs to verify what taking)  Get ophth sch.  Needle phobic so won't do GLP-1  Orders: -     Comprehensive metabolic panel -     Hemoglobin A1c  Primary hypertension Assessment & Plan: Chronic.  Not controlled.  Continue benicar hct 40/25.  Add amlodipine 5mg .    Orders: -     CBC with Differential/Platelet -     Comprehensive metabolic panel -     Magnesium -     amLODIPine Besylate; Take 1 tablet (5 mg total) by mouth daily.  Dispense: 30 tablet; Refill: 1  Hyperlipidemia associated with type 2 diabetes mellitus (HCC) Assessment & Plan: Chronic.  Controlled.  (LDL 89)   Continue lipitor 20mg    Localized edema Assessment & Plan: Chronic. Not controlled unless wearing compression stockings.   Work on exercise. Lasix daily prn   Long term current use of oral hypoglycemic drug  Screening for cervical cancer -     Cytology - PAP  Acute vaginitis -     Cervicovaginal ancillary only  Hand cramps  Abdominal cramps  Hand and abd cramps.  May be low Mg/K.(On hydrochlorothiazide and lasix prn).  Advised to take electrolytes daily and stay hydrated Pap done Vaginitis-check aptima swab.  May be from Jardiance and hygeine   Return in about 4 weeks (around 07/09/2023) for HTN, DM. Marland Kitchen    Angelena Sole, MD

## 2023-06-11 NOTE — Patient Instructions (Signed)

## 2023-06-11 NOTE — Assessment & Plan Note (Signed)
Chronic. Not controlled unless wearing compression stockings.   Work on exercise. Lasix daily prn

## 2023-06-11 NOTE — Assessment & Plan Note (Signed)
Chronic.  Not controlled.  Pt not taking metformin.   Continue jardiance 25mg , glimiperide 4mg (needs to verify what taking)  Get ophth sch.  Needle phobic so won't do GLP-1

## 2023-06-11 NOTE — Assessment & Plan Note (Signed)
Chronic.  Controlled.  (LDL 89)   Continue lipitor 20mg 

## 2023-06-12 ENCOUNTER — Other Ambulatory Visit: Payer: Self-pay | Admitting: *Deleted

## 2023-06-12 LAB — CERVICOVAGINAL ANCILLARY ONLY
Bacterial Vaginitis (gardnerella): POSITIVE — AB
Candida Glabrata: NEGATIVE
Candida Vaginitis: POSITIVE — AB
Comment: NEGATIVE
Comment: NEGATIVE
Comment: NEGATIVE
Comment: NEGATIVE
Trichomonas: NEGATIVE

## 2023-06-12 MED ORDER — FLUCONAZOLE 150 MG PO TABS
150.0000 mg | ORAL_TABLET | Freq: Once | ORAL | 0 refills | Status: AC
Start: 2023-06-12 — End: 2023-06-12

## 2023-06-12 MED ORDER — METRONIDAZOLE 500 MG PO TABS: 500.0000 mg | ORAL_TABLET | Freq: Two times a day (BID) | ORAL | 0 refills | Status: AC

## 2023-06-12 NOTE — Progress Notes (Signed)
Bv and yeast-send in metronidazole 500bid x7d and diflucan 150mg  x1-1 refill.  No drinking alcohol while on meds

## 2023-06-16 LAB — CYTOLOGY - PAP
Comment: NEGATIVE
Diagnosis: NEGATIVE
High risk HPV: NEGATIVE

## 2023-06-23 ENCOUNTER — Other Ambulatory Visit (HOSPITAL_COMMUNITY): Payer: Self-pay

## 2023-06-23 ENCOUNTER — Telehealth: Payer: Self-pay | Admitting: Family Medicine

## 2023-06-23 ENCOUNTER — Telehealth: Payer: Self-pay

## 2023-06-23 NOTE — Telephone Encounter (Signed)
 Pharmacy Patient Advocate Encounter   Received notification from Pt Calls Messages that prior authorization for Dexcom G7 Sensor is required/requested.   Insurance verification completed.   The patient is insured through [facility] .   Per test claim: PA required; PA submitted to above mentioned insurance via CoverMyMeds Key/confirmation #/EOC [id number] Status is pending

## 2023-06-23 NOTE — Telephone Encounter (Unsigned)
 Copied from CRM [id number]. Topic: General - Other >> Jun 23, 2023  9:53 AM Rolin D wrote: Reason for CRM: Patient stated that she has been trying to get Dexcom Glucose monitor and was advised an authorization is needed . Patient stated she has been trying to reach out to her insurance company to get a pre auth sent to office but hasn't been able to get in touch . Patient is asking for assistance in contacting insurance .

## 2023-06-26 ENCOUNTER — Other Ambulatory Visit (HOSPITAL_COMMUNITY): Payer: Self-pay

## 2023-06-26 NOTE — Telephone Encounter (Signed)
 Pharmacy Patient Advocate Encounter  Received notification from [facility] that Prior Authorization for Dexcom G7 Sensor has been DENIED.  Full denial letter will be uploaded to the media tab. See denial reason below.   PA #/Case ID/Reference #: [id number]  Denial Reason: After an additional clinical pharmacist review determined that the request does not meet the definition of medical necessity found in the member's benefit booklet.

## 2023-06-26 NOTE — Telephone Encounter (Signed)
Left detailed message informing patient of message below.

## 2023-06-28 ENCOUNTER — Other Ambulatory Visit: Payer: Self-pay | Admitting: Family Medicine

## 2023-06-28 NOTE — Telephone Encounter (Signed)
 Needs to sch her f/u appt for this month

## 2023-06-30 ENCOUNTER — Other Ambulatory Visit: Payer: Self-pay | Admitting: *Deleted

## 2023-06-30 MED ORDER — GLIMEPIRIDE 4 MG PO TABS: 4.0000 mg | ORAL_TABLET | Freq: Every day | ORAL | 1 refills | Status: DC

## 2023-07-23 ENCOUNTER — Other Ambulatory Visit: Payer: Self-pay | Admitting: Family Medicine

## 2023-07-23 DIAGNOSIS — E1165 Type 2 diabetes mellitus with hyperglycemia: Secondary | ICD-10-CM

## 2023-07-23 NOTE — Telephone Encounter (Signed)
Needs to sch f/u

## 2023-07-27 ENCOUNTER — Other Ambulatory Visit: Payer: Self-pay | Admitting: Family Medicine

## 2023-08-04 ENCOUNTER — Telehealth: Payer: Self-pay

## 2023-08-04 NOTE — Telephone Encounter (Signed)
Patient was identified as falling into the [place] Measure - Diabetes.   Patient was: Left voicemail to schedule with primary care provider.

## 2023-08-15 ENCOUNTER — Other Ambulatory Visit: Payer: Self-pay | Admitting: Family Medicine

## 2023-08-15 DIAGNOSIS — I10 Essential (primary) hypertension: Secondary | ICD-10-CM

## 2023-08-16 NOTE — Telephone Encounter (Signed)
 Needs appt

## 2023-08-27 ENCOUNTER — Other Ambulatory Visit: Payer: Self-pay | Admitting: Family Medicine

## 2023-08-27 NOTE — Telephone Encounter (Signed)
 Needs appt

## 2023-08-29 ENCOUNTER — Other Ambulatory Visit: Payer: Self-pay | Admitting: Family Medicine

## 2023-08-29 DIAGNOSIS — E1165 Type 2 diabetes mellitus with hyperglycemia: Secondary | ICD-10-CM

## 2023-09-04 ENCOUNTER — Telehealth: Payer: Self-pay

## 2023-09-04 NOTE — Telephone Encounter (Signed)
 Patient was identified as falling into the [place] Measure - Diabetes.   Patient was: Left voicemail to schedule with primary care provider.

## 2023-09-24 ENCOUNTER — Other Ambulatory Visit: Payer: Self-pay | Admitting: Family Medicine

## 2023-09-24 NOTE — Telephone Encounter (Signed)
 Needs appt

## 2023-10-01 ENCOUNTER — Telehealth: Payer: Self-pay | Admitting: Pharmacist

## 2023-10-01 NOTE — Progress Notes (Signed)
   10/01/2023  Patient ID: Wendy Sanford, female   DOB: 1970-12-09, 53 y.o.   MRN: [id number]  Pharmacy - [place] Metric Diabetes Review  This patient is appearing on a report for being at risk of not meeting goals for type 2 diabetes.  It appears that office has attempted to reach patient several times to schedule a follow up with PCP but they have not received an answer. Patient has requested refill for glimepiride but it was denied because she is due to have a follow up.   Lab Results  Component Value Date   HGBA1C 9.5 (H) 06/11/2023   Current therapy for diabetes: glimepiride 4mg  daily and Jardiance 25mg  daily   Past therapy - metformin - patient stopped because tablets were too large.   DexCom Continuous Glucose Monitor Rx was written by PCP but prior authorization was denied by her insurance.   I tried to outreach patient to discuss diabetes and medications. She might be a good candidate for GLP1 type agent if her [employer] plan would cover.  Unable to reach patient but I did LM on VM with my contact number [phone] and office number. Reminded her that she is due to see PCP for follow up.  .tbes

## 2023-10-18 ENCOUNTER — Other Ambulatory Visit: Payer: Self-pay | Admitting: Family Medicine

## 2023-10-18 DIAGNOSIS — I10 Essential (primary) hypertension: Secondary | ICD-10-CM

## 2023-10-18 DIAGNOSIS — E1169 Type 2 diabetes mellitus with other specified complication: Secondary | ICD-10-CM

## 2023-10-18 NOTE — Telephone Encounter (Signed)
 NEEDS APPT

## 2023-10-19 NOTE — Telephone Encounter (Signed)
 Med refill was denied. You will need to call our office to make a med refill appt. Thank you.

## 2023-10-29 ENCOUNTER — Encounter: Payer: Self-pay | Admitting: Family Medicine

## 2023-11-04 ENCOUNTER — Telehealth: Payer: Self-pay | Admitting: Family Medicine

## 2023-11-04 NOTE — Telephone Encounter (Signed)
 We find it necessary to inform you that [facility] and All [place] [facility] Practices/Providers will no longer be able to provide medical care to you.  We believe that our patient/provider relationship has been compromised and thus would request thta you seek care elsewhere. 10/29/23

## 2023-11-18 DIAGNOSIS — I10 Essential (primary) hypertension: Secondary | ICD-10-CM | POA: Diagnosis not present

## 2023-11-18 DIAGNOSIS — E119 Type 2 diabetes mellitus without complications: Secondary | ICD-10-CM | POA: Diagnosis not present

## 2023-11-18 DIAGNOSIS — R252 Cramp and spasm: Secondary | ICD-10-CM | POA: Diagnosis not present

## 2023-12-22 ENCOUNTER — Telehealth: Payer: Self-pay | Admitting: Family Medicine

## 2023-12-22 NOTE — Telephone Encounter (Signed)
 We find it necessary to inform you that [facility] and all [facility] Practices/Providers will no longer be able to proved medical care to you. We believe that our patient / provider relationship has been compromised and thus would request that you seek care elsewhere.

## 2024-02-12 ENCOUNTER — Telehealth: Payer: Self-pay

## 2024-02-12 NOTE — Telephone Encounter (Signed)
 Patient was identified as falling into the [place] Measure - Diabetes.   Patient was: Attribution and/or data issue.  Validation/Investigation needed.  Explanation:  patient has been dismissed from the practice.

## 2024-02-19 DIAGNOSIS — I10 Essential (primary) hypertension: Secondary | ICD-10-CM | POA: Diagnosis not present

## 2024-02-19 DIAGNOSIS — E119 Type 2 diabetes mellitus without complications: Secondary | ICD-10-CM | POA: Diagnosis not present

## 2024-05-02 ENCOUNTER — Other Ambulatory Visit: Payer: Self-pay | Admitting: Family Medicine

## 2024-05-02 DIAGNOSIS — Z1231 Encounter for screening mammogram for malignant neoplasm of breast: Secondary | ICD-10-CM

## 2024-05-26 ENCOUNTER — Ambulatory Visit

## 2024-06-02 ENCOUNTER — Inpatient Hospital Stay: Admission: RE | Admit: 2024-06-02 | Discharge: 2024-06-02 | Attending: Family Medicine | Admitting: Family Medicine

## 2024-06-02 ENCOUNTER — Other Ambulatory Visit: Payer: Self-pay | Admitting: Family Medicine

## 2024-06-02 DIAGNOSIS — N63 Unspecified lump in unspecified breast: Secondary | ICD-10-CM

## 2024-06-02 DIAGNOSIS — Z1231 Encounter for screening mammogram for malignant neoplasm of breast: Secondary | ICD-10-CM

## 2024-06-07 ENCOUNTER — Inpatient Hospital Stay: Admission: RE | Admit: 2024-06-07 | Discharge: 2024-06-07 | Attending: Family Medicine | Admitting: Family Medicine

## 2024-06-07 DIAGNOSIS — N63 Unspecified lump in unspecified breast: Secondary | ICD-10-CM

## 2024-06-07 DIAGNOSIS — N6310 Unspecified lump in the right breast, unspecified quadrant: Secondary | ICD-10-CM | POA: Diagnosis not present

## 2024-06-07 DIAGNOSIS — R928 Other abnormal and inconclusive findings on diagnostic imaging of breast: Secondary | ICD-10-CM | POA: Diagnosis not present

## 2024-06-09 DIAGNOSIS — Z135 Encounter for screening for eye and ear disorders: Secondary | ICD-10-CM | POA: Diagnosis not present

## 2024-06-09 DIAGNOSIS — I10 Essential (primary) hypertension: Secondary | ICD-10-CM | POA: Diagnosis not present

## 2024-06-09 DIAGNOSIS — Z1211 Encounter for screening for malignant neoplasm of colon: Secondary | ICD-10-CM | POA: Diagnosis not present

## 2024-06-09 DIAGNOSIS — Z0000 Encounter for general adult medical examination without abnormal findings: Secondary | ICD-10-CM | POA: Diagnosis not present

## 2024-06-09 DIAGNOSIS — E119 Type 2 diabetes mellitus without complications: Secondary | ICD-10-CM | POA: Diagnosis not present

## 2024-06-09 DIAGNOSIS — Z13220 Encounter for screening for lipoid disorders: Secondary | ICD-10-CM | POA: Diagnosis not present

## 2024-06-09 DIAGNOSIS — Z114 Encounter for screening for human immunodeficiency virus [HIV]: Secondary | ICD-10-CM | POA: Diagnosis not present

## 2024-06-09 DIAGNOSIS — Z1159 Encounter for screening for other viral diseases: Secondary | ICD-10-CM | POA: Diagnosis not present
